# Patient Record
Sex: Female | Born: 1945 | Race: White | Hispanic: No | Marital: Married | Smoking: Never smoker
Health system: Southern US, Community
[De-identification: ages and names within clinical notes are randomized; demographics above are authoritative.]

## PROBLEM LIST (undated history)

## (undated) DIAGNOSIS — K5792 Diverticulitis of intestine, part unspecified, without perforation or abscess without bleeding: Secondary | ICD-10-CM

## (undated) DIAGNOSIS — F329 Major depressive disorder, single episode, unspecified: Secondary | ICD-10-CM

## (undated) DIAGNOSIS — K429 Umbilical hernia without obstruction or gangrene: Secondary | ICD-10-CM

## (undated) DIAGNOSIS — K219 Gastro-esophageal reflux disease without esophagitis: Secondary | ICD-10-CM

## (undated) DIAGNOSIS — E785 Hyperlipidemia, unspecified: Secondary | ICD-10-CM

## (undated) DIAGNOSIS — M1611 Unilateral primary osteoarthritis, right hip: Secondary | ICD-10-CM

## (undated) DIAGNOSIS — R011 Cardiac murmur, unspecified: Secondary | ICD-10-CM

## (undated) DIAGNOSIS — M1990 Unspecified osteoarthritis, unspecified site: Secondary | ICD-10-CM

## (undated) DIAGNOSIS — J189 Pneumonia, unspecified organism: Secondary | ICD-10-CM

## (undated) DIAGNOSIS — K589 Irritable bowel syndrome without diarrhea: Secondary | ICD-10-CM

## (undated) DIAGNOSIS — F32A Depression, unspecified: Secondary | ICD-10-CM

## (undated) DIAGNOSIS — R0602 Shortness of breath: Secondary | ICD-10-CM

## (undated) DIAGNOSIS — E039 Hypothyroidism, unspecified: Secondary | ICD-10-CM

## (undated) HISTORY — DX: Irritable bowel syndrome, unspecified: K58.9

## (undated) HISTORY — PX: TUBAL LIGATION: SHX77

---

## 2001-02-28 HISTORY — PX: ABDOMINAL HYSTERECTOMY: SHX81

## 2009-02-28 HISTORY — PX: ESOPHAGOGASTRODUODENOSCOPY: SHX1529

## 2009-02-28 HISTORY — PX: COLONOSCOPY: SHX174

## 2009-10-16 ENCOUNTER — Ambulatory Visit (HOSPITAL_COMMUNITY): Admission: RE | Admit: 2009-10-16 | Discharge: 2009-10-16 | Payer: Self-pay | Admitting: General Surgery

## 2010-05-10 ENCOUNTER — Ambulatory Visit (INDEPENDENT_AMBULATORY_CARE_PROVIDER_SITE_OTHER): Payer: Medicare Other | Admitting: Internal Medicine

## 2010-05-10 DIAGNOSIS — R197 Diarrhea, unspecified: Secondary | ICD-10-CM

## 2010-05-10 DIAGNOSIS — R198 Other specified symptoms and signs involving the digestive system and abdomen: Secondary | ICD-10-CM

## 2010-08-25 ENCOUNTER — Ambulatory Visit (INDEPENDENT_AMBULATORY_CARE_PROVIDER_SITE_OTHER): Payer: Medicare Other | Admitting: Internal Medicine

## 2010-08-25 DIAGNOSIS — K5900 Constipation, unspecified: Secondary | ICD-10-CM

## 2011-01-04 ENCOUNTER — Encounter (HOSPITAL_COMMUNITY): Payer: Self-pay | Admitting: Pharmacy Technician

## 2011-01-04 ENCOUNTER — Other Ambulatory Visit: Payer: Self-pay

## 2011-01-04 ENCOUNTER — Encounter (HOSPITAL_COMMUNITY)
Admission: RE | Admit: 2011-01-04 | Discharge: 2011-01-04 | Disposition: A | Discharge status: Home / Self Care | Payer: Medicare Other | Source: Ambulatory Visit | Attending: General Surgery | Admitting: General Surgery

## 2011-01-04 ENCOUNTER — Encounter (HOSPITAL_COMMUNITY): Payer: Self-pay

## 2011-01-04 HISTORY — DX: Hypothyroidism, unspecified: E03.9

## 2011-01-04 HISTORY — DX: Cardiac murmur, unspecified: R01.1

## 2011-01-04 HISTORY — DX: Gastro-esophageal reflux disease without esophagitis: K21.9

## 2011-01-04 HISTORY — DX: Hyperlipidemia, unspecified: E78.5

## 2011-01-04 HISTORY — DX: Shortness of breath: R06.02

## 2011-01-04 HISTORY — DX: Unspecified osteoarthritis, unspecified site: M19.90

## 2011-01-04 LAB — DIFFERENTIAL
Lymphocytes Relative: 22 % (ref 12–46)
Lymphs Abs: 1.5 10*3/uL (ref 0.7–4.0)
Monocytes Relative: 8 % (ref 3–12)
Neutrophils Relative %: 69 % (ref 43–77)

## 2011-01-04 LAB — BASIC METABOLIC PANEL
CO2: 33 mEq/L — ABNORMAL HIGH (ref 19–32)
Chloride: 100 mEq/L (ref 96–112)
Glucose, Bld: 100 mg/dL — ABNORMAL HIGH (ref 70–99)
Potassium: 3.7 mEq/L (ref 3.5–5.1)
Sodium: 139 mEq/L (ref 135–145)

## 2011-01-04 LAB — CBC
Hemoglobin: 14.2 g/dL (ref 12.0–15.0)
Platelets: 217 10*3/uL (ref 150–400)
RBC: 4.82 MIL/uL (ref 3.87–5.11)
WBC: 6.8 10*3/uL (ref 4.0–10.5)

## 2011-01-04 LAB — SURGICAL PCR SCREEN: Staphylococcus aureus: NEGATIVE

## 2011-01-04 NOTE — Patient Instructions (Addendum)
20 JING HOWATT  01/04/2011   Your procedure is scheduled on:  01/10/2011  Report to [facility] at 06:15 AM.  Call this number if you have problems the morning of surgery: [phone]    Remember:   Do not eat food:After Midnight.  Do not drink clear liquids: After Midnight.  Take these medicines the morning of surgery with A SIP OF WATER: Synthroid, Zantac   Do not wear jewelry, make-up or nail polish.  Do not wear lotions, powders, or perfumes. You may wear deodorant.  Do not shave 48 hours prior to surgery.  Do not bring valuables to the hospital.  Contacts, dentures or bridgework may not be worn into surgery.  Leave suitcase in the car. After surgery it may be brought to your room.  For patients admitted to the hospital, checkout time is 11:00 AM the day of discharge.   Patients discharged the day of surgery will not be allowed to drive home.  Name and phone number of your driver:   Special Instructions: CHG Shower Use Special Wash: 1/2 bottle night before surgery and 1/2 bottle morning of surgery.   Please read over the following fact sheets that you were given: Pain Booklet, MRSA Information, Surgical Site Infection Prevention and Care and Recovery After Surgery      Laparoscopic Cholecystectomy Laparoscopic cholecystectomy is surgery to remove the gallbladder. The gallbladder is located slightly to the right of center in the abdomen, behind the liver. It is a concentrating and storage sac for the bile produced in the liver. Bile aids in the digestion and absorption of fats. Gallbladder disease (cholecystitis) is an inflammation of your gallbladder. This condition is usually caused by a buildup of gallstones (cholelithiasis) in your gallbladder. Gallstones can block the flow of bile, resulting in inflammation and pain. In severe cases, emergency surgery may be required. When emergency surgery is not required, you will have time to prepare for the procedure. Laparoscopic  surgery is an alternative to open surgery. Laparoscopic surgery usually has a shorter recovery time. Your common bile duct may also need to be examined and explored. Your caregiver will discuss this with you if he or she feels this should be done. If stones are found in the common bile duct, they may be removed. LET YOUR CAREGIVER KNOW ABOUT:  Allergies to food or medicine.   Medicines taken, including vitamins, herbs, eyedrops, over-the-counter medicines, and creams.   Use of steroids (by mouth or creams).   Previous problems with anesthetics or numbing medicines.   History of bleeding problems or blood clots.   Previous surgery.   Other health problems, including diabetes and kidney problems.   Possibility of pregnancy, if this applies.  RISKS AND COMPLICATIONS All surgery is associated with risks. Some problems that may occur following this procedure include:  Infection.   Damage to the common bile duct, nerves, arteries, veins, or other internal organs such as the stomach or intestines.   Bleeding.   A stone may remain in the common bile duct.  BEFORE THE PROCEDURE  Do not take aspirin for 3 days prior to surgery or blood thinners for 1 week prior to surgery.   Do not eat or drink anything after midnight the night before surgery.   Let your caregiver know if you develop a cold or other infectious problem prior to surgery.   You should be present 60 minutes before the procedure or as directed.  PROCEDURE  You will be given medicine that makes you  sleep (general anesthetic). When you are asleep, your surgeon will make several small cuts (incisions) in your abdomen. One of these incisions is used to insert a small, lighted scope (laparoscope) into the abdomen. The laparoscope helps the surgeon see into your abdomen. Carbon dioxide gas will be pumped into your abdomen. The gas allows more room for the surgeon to perform your surgery. Other operating instruments are inserted  through the other incisions. Laparoscopic procedures may not be appropriate when:  There is major scarring from previous surgery.   The gallbladder is extremely inflamed.   There are bleeding disorders or unexpected cirrhosis of the liver.   A pregnancy is near term.   Other conditions make the laparoscopic procedure impossible.  If your surgeon feels it is not safe to continue with a laparoscopic procedure, he or she will perform an open abdominal procedure. In this case, the surgeon will make an incision to open the abdomen. This gives the surgeon a larger view and field to work within. This may allow the surgeon to perform procedures that sometimes cannot be performed with a laparoscope alone. Open surgery has a longer recovery time. AFTER THE PROCEDURE  You will be taken to the recovery area where a nurse will watch and check your progress.   You may be allowed to go home the same day.   Do not resume physical activities until directed by your caregiver.   You may resume a normal diet and activities as directed.  Document Released: 02/14/2005 Document Revised: 10/27/2010 Document Reviewed: 07/30/2010 [facility] Patient Information 2012 [employer], [place].

## 2011-01-05 NOTE — Consult Note (Signed)
[name], [name]            ACCOUNT NO.:  [id number]  MEDICAL RECORD NO.:  [id number]  LOCATION:                                 FACILITY:  PHYSICIAN:  Barbaraann Barthel, M.D. DATE OF BIRTH:  11-25-1945  DATE OF CONSULTATION: DATE OF DISCHARGE:                                CONSULTATION   DIAGNOSIS:  Cholelithiasis, cholecystitis.  CHIEF COMPLAINT:  Right upper quadrant pain with nausea for approximately 2 years with a recent episode being several days ago.  HISTORY OF PRESENT MEDICAL:  The patient states that she has had some recurrent postprandial pain that has awakened her few hours after eating on and off for the last couple of years.  She dismissed this as being insignificant until this was more prominent over the last few days.  She had a sonogram which revealed the presence of cholelithiasis.  At that time, she also had liver function studies, which were grossly within normal limits.  Lipase was not elevated.  PAST MEDICAL HISTORY:  She has had a history of irritable bowel syndrome.  No other significant medical problems reported.  She was referred by Dr. Sherril Croon for surgery.  In the workup, she was noted to have a murmur which was investigated further with an echocardiogram. This revealed the presence of possible pericardial effusion and that this is being investigated further preoperatively.  We discussed this with Dr. Sherril Croon and he is going to obtain a CT scan, and get back with me to clear her for surgery.  PAST SURGERIES:  Included a C-section in 1967.  The tubal ligation in the 70s and a vaginal hysterectomy in 2003.  PHYSICAL EXAMINATION:  VITAL SIGNS:  This is a pleasant 65 year old white female who is 5 foot 7 inches and weighs 195 pounds, her temperature is 98.7, her pulse was 86 and regular, respirations are 12 per minute.  Blood pressure is 102/68. HEAD:  Normocephalic. EYES:  Extraocular movements are intact.  Pupils are equal and round and react to  light and accommodation. NECK:  There are no bruits auscultated and no adenopathy noted. CHEST:  Clear.  I do not appreciate any murmurs on my examination. BREASTS AND AXILLA:  Are without masses. ABDOMEN:  The patient is mildly tender in the right upper quadrant. There is no rebound tenderness appreciated. RECTAL EXAMINATION:  The stool is guaiac-negative. EXTREMITIES:  The patient has a chronic right ankle and knee arthritic complaints.  The patient is also has been told she has a Baker cyst in the posterior aspect of the left knee.  REVIEW OF SYSTEMS:  NEURO SYSTEM:  No history of migraines or seizures, grossly no lateralizing or localizing's neurological symptoms. ENDOCRINE SYSTEM:  No history of diabetes.  The patient has a history of hypothyroidism for which she takes thyroid supplement. CARDIOPULMONARY SYSTEM:  The patient has recently been told she had a murmur and she is being worked up preoperatively for this. MUSCULOSKELETAL SYSTEM:  Chronic arthritic complaints of knees and ankles.  OB/GYN HISTORY:  She is a gravida 3, para 2, cesarean 1, abortus 0 female who has no family history of first line relatives of carcinoma of the breast.  She has undergone a vaginal hysterectomy  as stated in 2003 and prior to this a C-section and tubal ligation in 1967 and 1970s respectively.  The patient underwent a mammogram in October 2012 which she reports as normal.  GI SYSTEM:  No history of hepatitis, diarrhea bright red rectal bleeding or melena.  No unexplained weight loss.  She had a colonoscopy in 2011. She has a history of irritable bowel syndrome and she occasionally has bouts of constipation. GU SYSTEM:  No history of nephrolithiasis.  No history of dysuria.  LABORATORY DATA:  The patient's last laboratory data shows a white count of 6.5 with an H and H of 14.5 and 41.6 with a platelet count of 233,000.  Blood sugar was 114 and metabolic 7 was grossly within normal limits.   Creatinine is 0.7 and a BUN of 17.  The liver function studies are all grossly within normal limits.  Cholesterol was 246.  Abdominal ultrasound showed small stones seen within the gallbladder. The common bile duct is not dilated.  IMPRESSION:  Therefore, [name] has cholecystitis secondary to cholelithiasis. Other medical problems include 1. Obesity. 2. Hypercholesteremia. 3. Chronic arthritis. 4. Hypothyroidism. 5. Pericardial effusion which is being worked up at present.  PLAN:  Following medical clearance, we will plan on doing an elective laparoscopic cholecystectomy on her.  She has been placed on a restricted diet in the interim.  I discussed surgery with her in detail, discussing complications not limited to, but including bleeding, infection, damage to bile ducts, perforation of organs, and transitory diarrhea postoperatively.  Informed consent was obtained.  We will plan for surgery on Monday if she is cleared and this will be coordinated with her internist.     Barbaraann Barthel, M.D.     WB/MEDQ  D:  01/04/2011  T:  01/04/2011  Job:  [id number]  cc:   Dr. Sherril Croon

## 2011-01-10 ENCOUNTER — Encounter (HOSPITAL_COMMUNITY): Payer: Self-pay | Admitting: *Deleted

## 2011-01-10 ENCOUNTER — Other Ambulatory Visit: Payer: Self-pay | Admitting: General Surgery

## 2011-01-10 ENCOUNTER — Encounter (HOSPITAL_COMMUNITY): Payer: Self-pay | Admitting: Anesthesiology

## 2011-01-10 ENCOUNTER — Ambulatory Visit (HOSPITAL_COMMUNITY): Payer: Medicare Other | Admitting: Anesthesiology

## 2011-01-10 ENCOUNTER — Inpatient Hospital Stay (HOSPITAL_COMMUNITY)
Admission: RE | Admit: 2011-01-10 | Discharge: 2011-01-11 | DRG: 419 | Disposition: A | Discharge status: Home / Self Care | Payer: Medicare Other | Source: Ambulatory Visit | Attending: General Surgery | Admitting: General Surgery

## 2011-01-10 ENCOUNTER — Encounter (HOSPITAL_COMMUNITY): Payer: Self-pay

## 2011-01-10 ENCOUNTER — Encounter (HOSPITAL_COMMUNITY)
Admission: RE | Disposition: A | Discharge status: Home / Self Care | Payer: Self-pay | Source: Ambulatory Visit | Attending: General Surgery

## 2011-01-10 DIAGNOSIS — K219 Gastro-esophageal reflux disease without esophagitis: Secondary | ICD-10-CM | POA: Diagnosis present

## 2011-01-10 DIAGNOSIS — K8010 Calculus of gallbladder with chronic cholecystitis without obstruction: Principal | ICD-10-CM | POA: Diagnosis present

## 2011-01-10 DIAGNOSIS — E669 Obesity, unspecified: Secondary | ICD-10-CM | POA: Diagnosis present

## 2011-01-10 DIAGNOSIS — Z9049 Acquired absence of other specified parts of digestive tract: Secondary | ICD-10-CM

## 2011-01-10 DIAGNOSIS — E7800 Pure hypercholesterolemia, unspecified: Secondary | ICD-10-CM | POA: Diagnosis present

## 2011-01-10 DIAGNOSIS — I10 Essential (primary) hypertension: Secondary | ICD-10-CM | POA: Diagnosis present

## 2011-01-10 DIAGNOSIS — M129 Arthropathy, unspecified: Secondary | ICD-10-CM | POA: Diagnosis present

## 2011-01-10 DIAGNOSIS — E039 Hypothyroidism, unspecified: Secondary | ICD-10-CM | POA: Diagnosis present

## 2011-01-10 HISTORY — PX: CHOLECYSTECTOMY: SHX55

## 2011-01-10 SURGERY — LAPAROSCOPIC CHOLECYSTECTOMY
Anesthesia: General | Site: Abdomen | Wound class: Clean Contaminated

## 2011-01-10 MED ORDER — ROCURONIUM BROMIDE 50 MG/5ML IV SOLN
INTRAVENOUS | Status: AC
  Filled 2011-01-10: qty 1

## 2011-01-10 MED ORDER — MIDAZOLAM HCL 2 MG/2ML IJ SOLN
INTRAMUSCULAR | Status: AC
  Administered 2011-01-10: 2 mg via INTRAVENOUS
  Filled 2011-01-10: qty 2

## 2011-01-10 MED ORDER — MIDAZOLAM HCL 5 MG/5ML IJ SOLN
INTRAMUSCULAR | Status: DC | PRN
  Administered 2011-01-10: 2 mg via INTRAVENOUS

## 2011-01-10 MED ORDER — PROPOFOL 10 MG/ML IV EMUL
INTRAVENOUS | Status: DC | PRN
  Administered 2011-01-10 (×2): 150 mg via INTRAVENOUS

## 2011-01-10 MED ORDER — FENTANYL CITRATE 0.05 MG/ML IJ SOLN
INTRAMUSCULAR | Status: DC | PRN
  Administered 2011-01-10 (×5): 50 ug via INTRAVENOUS

## 2011-01-10 MED ORDER — DEXTROSE 5 % IV SOLN
1.0000 g | INTRAVENOUS | Status: DC | PRN
  Administered 2011-01-10: 1 g via INTRAVENOUS

## 2011-01-10 MED ORDER — BUPIVACAINE HCL (PF) 0.5 % IJ SOLN
INTRAMUSCULAR | Status: DC | PRN
  Administered 2011-01-10: 7 mL

## 2011-01-10 MED ORDER — LEVOTHYROXINE SODIUM 88 MCG PO TABS
88.0000 ug | ORAL_TABLET | Freq: Every day | ORAL | Status: DC
  Administered 2011-01-11: 88 ug via ORAL
  Filled 2011-01-10 (×3): qty 1

## 2011-01-10 MED ORDER — ONDANSETRON HCL 4 MG/2ML IJ SOLN
4.0000 mg | Freq: Once | INTRAMUSCULAR | Status: AC
  Administered 2011-01-10: 4 mg via INTRAVENOUS

## 2011-01-10 MED ORDER — DEXTROSE 5 % IV SOLN
INTRAVENOUS | Status: AC
  Filled 2011-01-10: qty 10

## 2011-01-10 MED ORDER — LACTATED RINGERS IV SOLN
INTRAVENOUS | Status: DC
  Administered 2011-01-10: 1000 mL via INTRAVENOUS
  Administered 2011-01-10: 09:00:00 via INTRAVENOUS

## 2011-01-10 MED ORDER — MORPHINE SULFATE 2 MG/ML IJ SOLN
1.0000 mg | INTRAMUSCULAR | Status: DC | PRN
  Administered 2011-01-10: 1 mg via INTRAVENOUS
  Filled 2011-01-10: qty 1

## 2011-01-10 MED ORDER — DEXTROSE 5 % IV SOLN: 1.0000 g | Freq: Once | INTRAVENOUS | Status: DC

## 2011-01-10 MED ORDER — DEXAMETHASONE SODIUM PHOSPHATE 4 MG/ML IJ SOLN
4.0000 mg | INTRAMUSCULAR | Status: AC
  Administered 2011-01-10: 4 mg via INTRAVENOUS

## 2011-01-10 MED ORDER — ONDANSETRON HCL 4 MG/2ML IJ SOLN
4.0000 mg | Freq: Four times a day (QID) | INTRAMUSCULAR | Status: DC | PRN
  Administered 2011-01-10: 4 mg via INTRAVENOUS
  Filled 2011-01-10: qty 2

## 2011-01-10 MED ORDER — WATER FOR IRRIGATION, STERILE IR SOLN
Status: DC | PRN
  Administered 2011-01-10: 2000 mL

## 2011-01-10 MED ORDER — INFLUENZA VIRUS VACC SPLIT PF IM SUSP
0.5000 mL | INTRAMUSCULAR | Status: AC
  Administered 2011-01-11: 0.5 mL via INTRAMUSCULAR
  Filled 2011-01-10: qty 0.5

## 2011-01-10 MED ORDER — BUPIVACAINE HCL (PF) 0.5 % IJ SOLN
INTRAMUSCULAR | Status: AC
  Filled 2011-01-10: qty 30

## 2011-01-10 MED ORDER — GLYCOPYRROLATE 0.2 MG/ML IJ SOLN
INTRAMUSCULAR | Status: DC | PRN
  Administered 2011-01-10: .4 mg via INTRAVENOUS

## 2011-01-10 MED ORDER — LIDOCAINE HCL 1 % IJ SOLN
INTRAMUSCULAR | Status: DC | PRN
  Administered 2011-01-10: 25 mg via INTRADERMAL

## 2011-01-10 MED ORDER — DEXAMETHASONE SODIUM PHOSPHATE 4 MG/ML IJ SOLN
INTRAMUSCULAR | Status: AC
  Administered 2011-01-10: 4 mg via INTRAVENOUS
  Filled 2011-01-10: qty 1

## 2011-01-10 MED ORDER — GLYCOPYRROLATE 0.2 MG/ML IJ SOLN
INTRAMUSCULAR | Status: AC
  Administered 2011-01-10: 0.2 mg via INTRAVENOUS
  Filled 2011-01-10: qty 1

## 2011-01-10 MED ORDER — FAMOTIDINE 20 MG PO TABS
10.0000 mg | ORAL_TABLET | Freq: Every day | ORAL | Status: DC
  Administered 2011-01-10 – 2011-01-11 (×2): 10 mg via ORAL
  Filled 2011-01-10: qty 1
  Filled 2011-01-10: qty 2

## 2011-01-10 MED ORDER — ONDANSETRON HCL 4 MG PO TABS: 4.0000 mg | ORAL_TABLET | Freq: Four times a day (QID) | ORAL | Status: DC | PRN

## 2011-01-10 MED ORDER — NEOSTIGMINE METHYLSULFATE 1 MG/ML IJ SOLN
INTRAMUSCULAR | Status: AC
  Filled 2011-01-10: qty 10

## 2011-01-10 MED ORDER — ACETAMINOPHEN 325 MG PO TABS: 650.0000 mg | ORAL_TABLET | ORAL | Status: DC | PRN

## 2011-01-10 MED ORDER — SCOPOLAMINE 1 MG/3DAYS TD PT72
1.0000 | MEDICATED_PATCH | Freq: Once | TRANSDERMAL | Status: DC
  Administered 2011-01-10: 1.5 mg via TRANSDERMAL

## 2011-01-10 MED ORDER — MIDAZOLAM HCL 2 MG/2ML IJ SOLN
INTRAMUSCULAR | Status: AC
  Filled 2011-01-10: qty 2

## 2011-01-10 MED ORDER — GLYCOPYRROLATE 0.2 MG/ML IJ SOLN
INTRAMUSCULAR | Status: AC
  Filled 2011-01-10: qty 1

## 2011-01-10 MED ORDER — ONDANSETRON HCL 4 MG/2ML IJ SOLN
INTRAMUSCULAR | Status: AC
  Filled 2011-01-10: qty 2

## 2011-01-10 MED ORDER — ONDANSETRON HCL 4 MG/2ML IJ SOLN
INTRAMUSCULAR | Status: AC
  Administered 2011-01-10: 4 mg via INTRAVENOUS
  Filled 2011-01-10: qty 2

## 2011-01-10 MED ORDER — ONDANSETRON HCL 4 MG/2ML IJ SOLN
4.0000 mg | Freq: Once | INTRAMUSCULAR | Status: AC | PRN
  Administered 2011-01-10: 4 mg via INTRAVENOUS

## 2011-01-10 MED ORDER — FENTANYL CITRATE 0.05 MG/ML IJ SOLN
INTRAMUSCULAR | Status: AC
  Administered 2011-01-10: 50 ug via INTRAVENOUS
  Filled 2011-01-10: qty 5

## 2011-01-10 MED ORDER — SODIUM CHLORIDE 0.9 % IR SOLN
Status: DC | PRN
  Administered 2011-01-10: 1000 mL

## 2011-01-10 MED ORDER — ROCURONIUM BROMIDE 100 MG/10ML IV SOLN
INTRAVENOUS | Status: DC | PRN
  Administered 2011-01-10: 40 mg via INTRAVENOUS

## 2011-01-10 MED ORDER — POTASSIUM CHLORIDE IN NACL 20-0.9 MEQ/L-% IV SOLN
INTRAVENOUS | Status: DC
  Administered 2011-01-10 (×2): via INTRAVENOUS

## 2011-01-10 MED ORDER — FENTANYL CITRATE 0.05 MG/ML IJ SOLN
25.0000 ug | INTRAMUSCULAR | Status: DC | PRN
  Administered 2011-01-10: 25 ug via INTRAVENOUS
  Administered 2011-01-10: 50 ug via INTRAVENOUS
  Administered 2011-01-10: 25 ug via INTRAVENOUS

## 2011-01-10 MED ORDER — NEOSTIGMINE METHYLSULFATE 1 MG/ML IJ SOLN
INTRAMUSCULAR | Status: DC | PRN
  Administered 2011-01-10: 3 mg via INTRAVENOUS

## 2011-01-10 MED ORDER — MIDAZOLAM HCL 2 MG/2ML IJ SOLN
1.0000 mg | INTRAMUSCULAR | Status: DC | PRN
  Administered 2011-01-10: 2 mg via INTRAVENOUS

## 2011-01-10 MED ORDER — PNEUMOCOCCAL VAC POLYVALENT 25 MCG/0.5ML IJ INJ
0.5000 mL | INJECTION | INTRAMUSCULAR | Status: DC
  Filled 2011-01-10: qty 0.5

## 2011-01-10 MED ORDER — HYDROCHLOROTHIAZIDE 25 MG PO TABS
25.0000 mg | ORAL_TABLET | Freq: Every day | ORAL | Status: DC
  Administered 2011-01-10 – 2011-01-11 (×2): 25 mg via ORAL
  Filled 2011-01-10 (×2): qty 1

## 2011-01-10 MED ORDER — LIDOCAINE HCL (PF) 1 % IJ SOLN
INTRAMUSCULAR | Status: AC
  Filled 2011-01-10: qty 5

## 2011-01-10 MED ORDER — SODIUM CHLORIDE 0.9 % IR SOLN
Status: DC | PRN
  Administered 2011-01-10: 3000 mL

## 2011-01-10 MED ORDER — GLYCOPYRROLATE 0.2 MG/ML IJ SOLN
0.2000 mg | Freq: Once | INTRAMUSCULAR | Status: AC
  Administered 2011-01-10: 0.2 mg via INTRAVENOUS

## 2011-01-10 MED ORDER — SCOPOLAMINE 1 MG/3DAYS TD PT72
MEDICATED_PATCH | TRANSDERMAL | Status: AC
  Administered 2011-01-10: 1.5 mg via TRANSDERMAL
  Filled 2011-01-10: qty 1

## 2011-01-10 MED ORDER — PROPOFOL 10 MG/ML IV EMUL
INTRAVENOUS | Status: AC
  Filled 2011-01-10: qty 20

## 2011-01-10 MED ORDER — BIOTENE DRY MOUTH MT LIQD
15.0000 mL | Freq: Two times a day (BID) | OROMUCOSAL | Status: DC
  Administered 2011-01-10: 15 mL via OROMUCOSAL

## 2011-01-10 SURGICAL SUPPLY — 69 items
APPLICATOR COTTON TIP 6IN STRL (MISCELLANEOUS) IMPLANT
APPLIER CLIP ROT 10 11.4 M/L (STAPLE) ×2
ATTRACTOMAT 16X20 MAGNETIC DRP (DRAPES) IMPLANT
BAG HAMPER (MISCELLANEOUS) ×2 IMPLANT
BLADE SURG 15 STRL LF DISP TIS (BLADE) ×2 IMPLANT
BLADE SURG 15 STRL SS (BLADE) ×2
BLADE SURG SZ10 CARB STEEL (BLADE) IMPLANT
CLIP APPLIE ROT 10 11.4 M/L (STAPLE) ×1 IMPLANT
CLOTH BEACON ORANGE TIMEOUT ST (SAFETY) ×2 IMPLANT
COVER LIGHT HANDLE STERIS (MISCELLANEOUS) ×4 IMPLANT
DECANTER SPIKE VIAL GLASS SM (MISCELLANEOUS) ×2 IMPLANT
DISSECTOR BLUNT TIP ENDO 5MM (MISCELLANEOUS) ×2 IMPLANT
DRAPE WARM FLUID 44X44 (DRAPE) IMPLANT
DRSG TEGADERM 2-3/8X2-3/4 SM (GAUZE/BANDAGES/DRESSINGS) ×2 IMPLANT
ELECT BLADE 6 FLAT ULTRCLN (ELECTRODE) IMPLANT
ELECT REM PT RETURN 9FT ADLT (ELECTROSURGICAL) ×2
ELECTRODE REM PT RTRN 9FT ADLT (ELECTROSURGICAL) ×1 IMPLANT
EVACUATOR DRAINAGE 10X20 100CC (DRAIN) ×1 IMPLANT
EVACUATOR SILICONE 100CC (DRAIN) ×1
FILTER SMOKE EVAC LAPAROSHD (FILTER) ×2 IMPLANT
FORMALIN 10 PREFIL 120ML (MISCELLANEOUS) ×2 IMPLANT
GAUZE SPONGE 4X4 12PLY STRL LF (GAUZE/BANDAGES/DRESSINGS) ×2 IMPLANT
GLOVE BIOGEL PI IND STRL 7.0 (GLOVE) ×1 IMPLANT
GLOVE BIOGEL PI IND STRL 7.5 (GLOVE) ×1 IMPLANT
GLOVE BIOGEL PI INDICATOR 7.0 (GLOVE) ×1
GLOVE BIOGEL PI INDICATOR 7.5 (GLOVE) ×1
GLOVE ECLIPSE 6.5 STRL STRAW (GLOVE) ×2 IMPLANT
GLOVE ECLIPSE 7.0 STRL STRAW (GLOVE) ×2 IMPLANT
GLOVE EXAM NITRILE MD LF STRL (GLOVE) ×2 IMPLANT
GLOVE SKINSENSE NS SZ7.0 (GLOVE) ×1
GLOVE SKINSENSE STRL SZ7.0 (GLOVE) ×1 IMPLANT
GOWN BRE IMP SLV AUR XL STRL (GOWN DISPOSABLE) ×2 IMPLANT
GOWN STRL REIN XL XLG (GOWN DISPOSABLE) ×4 IMPLANT
HEMOSTAT SNOW SURGICEL 2X4 (HEMOSTASIS) ×2 IMPLANT
HEMOSTAT SURGICEL 4X8 (HEMOSTASIS) ×2 IMPLANT
INST SET LAPROSCOPIC AP (KITS) ×2 IMPLANT
IV NS IRRIG 3000ML ARTHROMATIC (IV SOLUTION) ×2 IMPLANT
KIT ROOM TURNOVER APOR (KITS) ×2 IMPLANT
KIT TROCAR LAP CHOLE (TROCAR) ×2 IMPLANT
MANIFOLD NEPTUNE II (INSTRUMENTS) ×2 IMPLANT
NS IRRIG 1000ML POUR BTL (IV SOLUTION) ×2 IMPLANT
PACK LAP CHOLE LZT030E (CUSTOM PROCEDURE TRAY) ×2 IMPLANT
PAD ARMBOARD 7.5X6 YLW CONV (MISCELLANEOUS) ×2 IMPLANT
PENCIL HANDSWITCHING (ELECTRODE) IMPLANT
POUCH SPECIMEN RETRIEVAL 10MM (ENDOMECHANICALS) ×2 IMPLANT
SET BASIN LINEN APH (SET/KITS/TRAYS/PACK) ×2 IMPLANT
SET TUBE IRRIG SUCTION NO TIP (IRRIGATION / IRRIGATOR) ×2 IMPLANT
SOL PREP PROV IODINE SCRUB 4OZ (MISCELLANEOUS) ×2 IMPLANT
SPONGE DRAIN TRACH 4X4 STRL 2S (GAUZE/BANDAGES/DRESSINGS) ×2 IMPLANT
SPONGE GAUZE 4X4 12PLY (GAUZE/BANDAGES/DRESSINGS) ×2 IMPLANT
SPONGE INTESTINAL PEANUT (DISPOSABLE) IMPLANT
SPONGE LAP 18X18 X RAY DECT (DISPOSABLE) IMPLANT
STAPLER VISISTAT 35W (STAPLE) ×2 IMPLANT
SUT ETHILON 3 0 FSL (SUTURE) ×2 IMPLANT
SUT SILK 2 0 (SUTURE)
SUT SILK 2 0 SH (SUTURE) IMPLANT
SUT SILK 2-0 18XBRD TIE 12 (SUTURE) IMPLANT
SUT SILK 3 0 SH CR/8 (SUTURE) IMPLANT
SUT VIC AB 0 CT1 27 (SUTURE)
SUT VIC AB 0 CT1 27XBRD ANTBC (SUTURE) IMPLANT
SUT VIC AB 0 CT1 27XCR 8 STRN (SUTURE) IMPLANT
SUT VICRYL 0 UR6 27IN ABS (SUTURE) ×2 IMPLANT
SYR BULB IRRIGATION 50ML (SYRINGE) IMPLANT
TAPE CLOTH SURG 4X10 WHT LF (GAUZE/BANDAGES/DRESSINGS) ×2 IMPLANT
TOWEL OR 17X26 4PK STRL BLUE (TOWEL DISPOSABLE) IMPLANT
TRAY FOLEY CATH 14FR (SET/KITS/TRAYS/PACK) ×2 IMPLANT
WARMER LAPAROSCOPE (MISCELLANEOUS) ×2 IMPLANT
WATER STERILE IRR 1000ML POUR (IV SOLUTION) ×4 IMPLANT
YANKAUER SUCT BULB TIP 10FT TU (MISCELLANEOUS) IMPLANT

## 2011-01-10 NOTE — Brief Op Note (Signed)
01/10/2011  9:42 AM  PATIENT:  [name]  65 y.o. female  PRE-OPERATIVE DIAGNOSIS:  Cholelithiasis, Cholecystitis  POST-OPERATIVE DIAGNOSIS:  Cholelithiasis, Cholecystitis  PROCEDURE:  Procedure(s): LAPAROSCOPIC CHOLECYSTECTOMY  SURGEON:  Surgeon(s): Marlane Hatcher  PHYSICIAN ASSISTANT:   ASSISTANTS: none   ANESTHESIA:   general  EBL:  Total I/O In: 1550 [I.V.:1500; IV Piggyback:50] Out: 355 [Urine:300; Other:30; Blood:25]  BLOOD ADMINISTERED:none  DRAINS: JP drain in liver bed  LOCAL MEDICATIONS USED:  MARCAINE 10CC  SPECIMEN:  Source of Specimen:  gall bladder and stones  DISPOSITION OF SPECIMEN:  PATHOLOGY  COUNTS:  YES  TOURNIQUET:  * No tourniquets in log *  DICTATION: .Other Dictation: Dictation Number OR Dict. # [id number]  PLAN OF CARE: Admit for overnight observation  PATIENT DISPOSITION:  PACU - hemodynamically stable.   Delay start of Pharmacological VTE agent (>24hrs) due to surgical blood loss or risk of bleeding:  {YES/NO/NOT APPLICABLE:20182

## 2011-01-10 NOTE — Anesthesia Preprocedure Evaluation (Addendum)
Anesthesia Evaluation  Patient identified by MRN, date of birth, ID band Patient awake    Reviewed: Allergy & Precautions, H&P , NPO status , Patient's Chart, lab work & pertinent test results  History of Anesthesia Complications (+) PONV  Airway Mallampati: I TM Distance: >3 FB Neck ROM: Full    Dental  (+) Teeth Intact   Pulmonary shortness of breath and with exertion,    Pulmonary exam normal       Cardiovascular hypertension, Pt. on medications + Valvular Problems/Murmurs Regular Normal    Neuro/Psych    GI/Hepatic GERD-  Medicated and Controlled,  Endo/Other  Hypothyroidism   Renal/GU      Musculoskeletal   Abdominal   Peds  Hematology   Anesthesia Other Findings   Reproductive/Obstetrics                           Anesthesia Physical Anesthesia Plan  ASA: II  Anesthesia Plan: General   Post-op Pain Management:    Induction: Intravenous, Rapid sequence and Cricoid pressure planned  Airway Management Planned: Oral ETT  Additional Equipment:   Intra-op Plan:   Post-operative Plan: Extubation in OR  Informed Consent: I have reviewed the patients History and Physical, chart, labs and discussed the procedure including the risks, benefits and alternatives for the proposed anesthesia with the patient or authorized representative who has indicated his/her understanding and acceptance.     Plan Discussed with: CRNA  Anesthesia Plan Comments:        Anesthesia Quick Evaluation

## 2011-01-10 NOTE — Anesthesia Procedure Notes (Addendum)
Procedure Name: Intubation Date/Time: 01/10/2011 8:07 AM Performed by: Despina Hidden Pre-anesthesia Checklist: Emergency Drugs available, Suction available, Patient being monitored and Patient identified Patient Re-evaluated:Patient Re-evaluated prior to inductionOxygen Delivery Method: Circle System Utilized Preoxygenation: Pre-oxygenation with 100% oxygen Intubation Type: IV induction and Circoid Pressure applied Ventilation: Mask ventilation with difficulty Laryngoscope Size: 3 and Mac Grade View: Grade I Tube type: Oral Tube size: 7.0 mm Airway Equipment and Method: stylet Placement Confirmation: ETT inserted through vocal cords under direct vision,  positive ETCO2 and breath sounds checked- equal and bilateral Secured at: 22 cm Tube secured with: Tape Dental Injury: Teeth and Oropharynx as per pre-operative assessment  Comments: Gel donut used under head

## 2011-01-10 NOTE — Op Note (Signed)
[name], [name]            ACCOUNT NO.:  [id number]  MEDICAL RECORD NO.:  [id number]  LOCATION:  [facility]                          FACILITY:  [facility]  PHYSICIAN:  Barbaraann Barthel, M.D. DATE OF BIRTH:  09-12-1945  DATE OF PROCEDURE:  01/10/2011 DATE OF DISCHARGE:                              OPERATIVE REPORT   SURGEON:  Barbaraann Barthel, MD  PREOPERATIVE DIAGNOSIS:  Cholecystitis and cholelithiasis.  POSTOPERATIVE DIAGNOSES:  Cholecystitis and cholelithiasis.  PROCEDURE:  Laparoscopic cholecystectomy.  SPECIMEN:  Gallbladder and stones.  WOUND CLASSIFICATION:  Clean contaminated.  Note, this is a 65 year old white female who was referred from Dr. Sherril Croon' office in [place] for cholecystectomy when this patient was found to be symptomatic from biliary colic.  In essence, she had over 2 years of symptoms of recurrent right upper quadrant postprandial pain with a recent flare-up.  During her workup, her sonogram revealed that she had cholelithiasis.  She also had an echocardiogram done preoperatively, which suggested the possibility of pericardial fluid.  This was investigated further with a CT scan, which did not reveal any pericardial fluid.  She was cleared for surgery.  Her liver function studies preoperatively and lipase were grossly within normal limits.  We discussed surgery with her in detail discussing complications not limited to, but including bleeding, infection, damage to bile ducts, perforation of organs, and the possibility of open surgery might be required.  We also discussed the possibility of transitory diarrhea. Informed consent was obtained.  GROSS OPERATIVE FINDINGS:  The patient had multiple adhesions and an inflamed lymph node around the gallbladder.  She had a cystic duct, which was not enlarged and I did not palpate with the [name] any stones within it.  No other abnormalities around the right upper quadrant.  She did have some adhesions around  the umbilicus for which she had previous gynecological surgery.  There were no abnormal encounters.  TECHNIQUE:  The patient was placed in supine position.  After the adequate administration of general anesthesia via endotracheal intubation, her entire abdomen was prepped with Betadine solution. Prior to this, a Foley catheter was aseptically inserted.  A periumbilical incision was carried out over the superior aspect of the umbilicus down to the fascia, which was grasped with a sharp towel clip and a Veress needle was inserted and confirmed the position with a saline drop test.  We then insufflated the abdomen with approximately 3.5 L of CO2 and then placed an 11-mm cannula in the umbilicus using the Visiport technique.  Another 11-mm cannula was placed under direct vision in the epigastrium and two 5-mm cannulas were placed in the right upper quadrant laterally.  The gallbladder was grasped and adhesions were taken down.  The cystic duct was clearly visualized, triply silver clipped on the side of the common bile duct, and singly silver clipped on the side of the gallbladder and divided.  The cystic artery was likewise ligated with silver clips and divided.  There were a couple of posterior twigs, which were clipped as well as the patient had an inflamed lymph node in this area.  The gallbladder was then removed using the hook cautery device from the liver bed without any spillage. We  then placed it in the endosac and we retrieved it from the peritoneal cavity.  We checked for hemostasis and irrigated and I elected to leave a piece of Surgicel on the liver bed and a Jackson-Pratt drain, which I will remove on the 1st postoperative day as well and this was exited from one of the lateral port sites.  Prior to closure, all sponge, needle, and instrument counts were found to be correct.  Estimated blood loss was less than 25 mL.  The patient received 1400 mL of  crystalloids intraoperatively.  Operating time was less than an hour.  There were no complications.     Barbaraann Barthel, M.D.     WB/MEDQ  D:  01/10/2011  T:  01/10/2011  Job:  [id number]  cc:   Doreen Beam, MD Fax: [phone]

## 2011-01-10 NOTE — Anesthesia Postprocedure Evaluation (Addendum)
  Anesthesia Post-op Note  Patient: [name]  Procedure(s) Performed:  LAPAROSCOPIC CHOLECYSTECTOMY  Patient Location: PACU  Anesthesia Type: General  Level of Consciousness: awake, alert , oriented and patient cooperative  Airway and Oxygen Therapy: Patient Spontanous Breathing  Post-op Pain: 2 /10, mild  Post-op Assessment: Post-op Vital signs reviewed, Patient's Cardiovascular Status Stable, Respiratory Function Stable, Patent Airway, No signs of Nausea or vomiting and Pain level controlled  Post-op Vital Signs: Reviewed and stable  Complications: No apparent anesthesia complications  Pt. Discharged. No apparent complications T.Shanae Luo CRNA 01/11/2011 1126

## 2011-01-10 NOTE — Progress Notes (Signed)
Post Op Check  Filed Vitals:   01/10/11 1430  BP: 139/72  Pulse: 82  Temp: 98.4 F (36.9 C)  Resp: 18    Awake and alert dressings dry and in tact.  Min. JP drainage.   No leg pain no shortness of breath.  Voided in RR but not in room yet.  Pain under control with present regimen.

## 2011-01-10 NOTE — Transfer of Care (Signed)
Immediate Anesthesia Transfer of Care Note  Patient: [name]  Procedure(s) Performed:  LAPAROSCOPIC CHOLECYSTECTOMY  Patient Location: PACU  Anesthesia Type: General  Level of Consciousness: awake and patient cooperative  Airway & Oxygen Therapy: Patient Spontanous Breathing and Patient connected to face mask oxygen  Post-op Assessment: Report given to PACU RN, Post -op Vital signs reviewed and stable and Patient moving all extremities X 4  Post vital signs: Reviewed and stable  Complications: No apparent anesthesia complications

## 2011-01-10 NOTE — Plan of Care (Signed)
Problem: Phase I Progression Outcomes Goal: Tubes/drains patent Outcome: Completed/Met Date Met:  01/10/11 jp drain Goal: Voiding-avoid urinary catheter unless indicated Outcome: Completed/Met Date Met:  01/10/11 No foley

## 2011-01-10 NOTE — Progress Notes (Signed)
Dr. Jayme Cloud notified of heart rate occasionally dropping to upper 40's.  Sinus arrythmia

## 2011-01-10 NOTE — Progress Notes (Signed)
No clinical change since H&P.  Dict # [id number]  Pt cleared for surgery by Med service.  There was some question of a small pericardial effusion.  This was not seen on CT scan.  Labs reviewed.  Procedure and risks discussed and informed consent obtained.

## 2011-01-11 LAB — CBC
HCT: 35.6 % — ABNORMAL LOW (ref 36.0–46.0)
Hemoglobin: 12.2 g/dL (ref 12.0–15.0)
RBC: 4.12 MIL/uL (ref 3.87–5.11)
WBC: 8.1 10*3/uL (ref 4.0–10.5)

## 2011-01-11 LAB — BASIC METABOLIC PANEL
BUN: 6 mg/dL (ref 6–23)
Chloride: 101 mEq/L (ref 96–112)
Glucose, Bld: 99 mg/dL (ref 70–99)
Potassium: 4 mEq/L (ref 3.5–5.1)
Sodium: 136 mEq/L (ref 135–145)

## 2011-01-11 LAB — HEPATIC FUNCTION PANEL
ALT: 28 U/L (ref 0–35)
AST: 30 U/L (ref 0–37)
Albumin: 3.1 g/dL — ABNORMAL LOW (ref 3.5–5.2)
Bilirubin, Direct: 0.1 mg/dL (ref 0.0–0.3)
Total Bilirubin: 0.5 mg/dL (ref 0.3–1.2)

## 2011-01-11 MED ORDER — OXYCODONE-ACETAMINOPHEN 2.5-325 MG PO TABS: 2.0000 | ORAL_TABLET | ORAL | Status: AC | PRN

## 2011-01-11 NOTE — Progress Notes (Signed)
Discharge note dictated no number was given.

## 2011-01-11 NOTE — Discharge Summary (Signed)
[name], [name]            ACCOUNT NO.:  [id number]  MEDICAL RECORD NO.:  [id number]  LOCATION:  [facility]                          FACILITY:  [facility]  PHYSICIAN:  Barbaraann Barthel, M.D. DATE OF BIRTH:  Feb 23, 1946  DATE OF ADMISSION:  01/10/2011 DATE OF DISCHARGE:  11/13/2012LH                              DISCHARGE SUMMARY   DIAGNOSES:  Cholecystitis and cholelithiasis.  SECONDARY DIAGNOSES: 1. Hypothyroidism. 2. Hypercholesterolemia. 3. Chronic arthritis. 4. Obesity.  PROCEDURE:  On January 10, 2011, laparoscopic cholecystectomy.  HOSPITAL COURSE:  This 65 year old white female was admitted via the outpatient department after being cleared for surgery by the Medical Service.  There was some question or not whether she had a pericardial infusion, this turned out to be over reading of an echocardiogram.  She was cleared for surgery and she underwent an uneventful laparoscopic cholecystectomy via the outpatient department.  Her hospital course was completely uneventful.  She was discharged on the following day after 24-hour period of observation.  At that time, her wound was clean.  She was tolerating p.o. solids.  She had no dysuria, leg pain, or shortness of breath.  Her wound was redressed and her Jackson-Pratt drain, which was draining very little was removed.  Postoperatively, her liver function studies remained within normal limits as did the rest of her laboratory workup.  For details, consult the chart for laboratory numbers.  Followup was arranged and she is to follow up with me a week from today, that would be January 18, 2011.  She is told to contact me should she have any acute changes or go to the emergency room should she not be able to reach me.  She is urged to go back to Dr. Sherril Croon after the perioperative period to continue her medical care.     Barbaraann Barthel, M.D.     WB/MEDQ  D:  01/11/2011  T:  01/11/2011  Job:  [id number]  cc:   Dr. Sherril Croon

## 2011-01-11 NOTE — Progress Notes (Signed)
POD # 1  Filed Vitals:   01/11/11 0659  BP: 103/62  Pulse: 63  Temp: 98.1 F (36.7 C)  Resp: 20    Doing well post op.  Wound clean and redressed.  Min. JP drainage.  Abdom. Soft and tolerated PO solids.  No dysuria leg pain or shortness of breath.  Labs OK, LFS  Normal.  Discharge and follow up arranged.  Home today.

## 2011-01-11 NOTE — Addendum Note (Signed)
Addendum  created 01/11/11 1126 by Minerva Areola, CRNA   Modules edited:Notes Section

## 2011-01-11 NOTE — Progress Notes (Signed)
Pt discharged home via family; Pt and family given and explained all discharge instructions, carenotes, and prescriptions; pt and family stated understanding and denied questions/concerns; all f/u appointments in place; IV removed without complicaitons; pt stable at time of discharge  

## 2011-01-11 NOTE — Progress Notes (Signed)
UR Chart Review Completed  

## 2011-01-14 ENCOUNTER — Encounter (HOSPITAL_COMMUNITY): Payer: Self-pay | Admitting: General Surgery

## 2012-04-17 ENCOUNTER — Encounter (HOSPITAL_COMMUNITY): Payer: Self-pay | Admitting: Pharmacy Technician

## 2012-04-20 NOTE — Patient Instructions (Signed)
BETHANEE REDONDO  04/20/2012   Your procedure is scheduled on:  05/03/12  Report to [facility] at 0930 AM.  Call this number if you have problems the morning of surgery: [phone]   Remember:   Do not eat food or drink liquids after midnight.   Take these medicines the morning of surgery with A SIP OF WATER: zantac, synthroid, HCTZ   Do not wear jewelry, make-up or nail polish.  Do not wear lotions, powders, or perfumes. You may wear deodorant.  Do not shave 48 hours prior to surgery. Men may shave face and neck.  Do not bring valuables to the hospital.  Contacts, dentures or bridgework may not be worn into surgery.  Leave suitcase in the car. After surgery it may be brought to your room.  For patients admitted to the hospital, checkout time is 11:00 AM the day of  discharge.   Patients discharged the day of surgery will not be allowed to drive  home.  Name and phone number of your driver: family  Special Instructions: N/A   Please read over the following fact sheets that you were given: Anesthesia Post-op Instructions and Care and Recovery After Surgery   PATIENT INSTRUCTIONS POST-ANESTHESIA  IMMEDIATELY FOLLOWING SURGERY:  Do not drive or operate machinery for the first twenty four hours after surgery.  Do not make any important decisions for twenty four hours after surgery or while taking narcotic pain medications or sedatives.  If you develop intractable nausea and vomiting or a severe headache please notify your doctor immediately.  FOLLOW-UP:  Please make an appointment with your surgeon as instructed. You do not need to follow up with anesthesia unless specifically instructed to do so.  WOUND CARE INSTRUCTIONS (if applicable):  Keep a dry clean dressing on the anesthesia/puncture wound site if there is drainage.  Once the wound has quit draining you may leave it open to air.  Generally you should leave the bandage intact for twenty four hours unless there is drainage.  If the  epidural site drains for more than 36-48 hours please call the anesthesia department.  QUESTIONS?:  Please feel free to call your physician or the hospital operator if you have any questions, and they will be happy to assist you.      Cataract Surgery  A cataract is a clouding of the lens of the eye. When a lens becomes cloudy, vision is reduced based on the degree and nature of the clouding. Surgery may be needed to improve vision. Surgery removes the cloudy lens and usually replaces it with a substitute lens (intraocular lens, IOL). LET YOUR EYE DOCTOR KNOW ABOUT:  Allergies to food or medicine.  Medicines taken including herbs, eyedrops, over-the-counter medicines, and creams.  Use of steroids (by mouth or creams).  Previous problems with anesthetics or numbing medicine.  History of bleeding problems or blood clots.  Previous surgery.  Other health problems, including diabetes and kidney problems.  Possibility of pregnancy, if this applies. RISKS AND COMPLICATIONS  Infection.  Inflammation of the eyeball (endophthalmitis) that can spread to both eyes (sympathetic ophthalmia).  Poor wound healing.  If an IOL is inserted, it can later fall out of proper position. This is very uncommon.  Clouding of the part of your eye that holds an IOL in place. This is called an "after-cataract." These are uncommon, but easily treated. BEFORE THE PROCEDURE  Do not eat or drink anything except small amounts of water for 8 to 12 before your  surgery, or as directed by your caregiver.  Unless you are told otherwise, continue any eyedrops you have been prescribed.  Talk to your primary caregiver about all other medicines that you take (both prescription and non-prescription). In some cases, you may need to stop or change medicines near the time of your surgery. This is most important if you are taking blood-thinning medicine.Do not stop medicines unless you are told to do so.  Arrange for  someone to drive you to and from the procedure.  Do not put contact lenses in either eye on the day of your surgery. PROCEDURE There is more than one method for safely removing a cataract. Your doctor can explain the differences and help determine which is best for you. Phacoemulsification surgery is the most common form of cataract surgery.  An injection is given behind the eye or eyedrops are given to make this a painless procedure.  A small cut (incision) is made on the edge of the clear, dome-shaped surface that covers the front of the eye (cornea).  A tiny probe is painlessly inserted into the eye. This device gives off ultrasound waves that soften and break up the cloudy center of the lens. This makes it easier for the cloudy lens to be removed by suction.  An IOL may be implanted.  The normal lens of the eye is covered by a clear capsule. Part of that capsule is intentionally left in the eye to support the IOL.  Your surgeon may or may not use stitches to close the incision. There are other forms of cataract surgery that require a larger incision and stiches to close the eye. This approach is taken in cases where the doctor feels that the cataract cannot be easily removed using phacoemulsification. AFTER THE PROCEDURE  When an IOL is implanted, it does not need care. It becomes a permanent part of your eye and cannot be seen or felt.  Your doctor will schedule follow-up exams to check on your progress.  Review your other medicines with your doctor to see which can be resumed after surgery.  Use eyedrops or take medicine as prescribed by your doctor. Document Released: 02/03/2011 Document Revised: 05/09/2011 Document Reviewed: 02/03/2011 [facility] Patient Information 2013 [place], [place].

## 2012-04-23 ENCOUNTER — Other Ambulatory Visit: Payer: Self-pay

## 2012-04-23 ENCOUNTER — Encounter (HOSPITAL_COMMUNITY): Payer: Self-pay

## 2012-04-23 ENCOUNTER — Encounter (HOSPITAL_COMMUNITY)
Admission: RE | Admit: 2012-04-23 | Discharge: 2012-04-23 | Disposition: A | Discharge status: Home / Self Care | Payer: Medicare Other | Source: Ambulatory Visit | Attending: Ophthalmology | Admitting: Ophthalmology

## 2012-04-23 LAB — HEMOGLOBIN AND HEMATOCRIT, BLOOD
HCT: 41.3 % (ref 36.0–46.0)
Hemoglobin: 13.7 g/dL (ref 12.0–15.0)

## 2012-04-23 LAB — BASIC METABOLIC PANEL
CO2: 33 mEq/L — ABNORMAL HIGH (ref 19–32)
Chloride: 101 mEq/L (ref 96–112)
Creatinine, Ser: 0.74 mg/dL (ref 0.50–1.10)
GFR calc Af Amer: >90 mL/min (ref >90)
Sodium: 141 mEq/L (ref 135–145)

## 2012-05-02 MED ORDER — LIDOCAINE HCL 3.5 % OP GEL
OPHTHALMIC | Status: AC
  Filled 2012-05-02: qty 5

## 2012-05-02 MED ORDER — PHENYLEPHRINE HCL 2.5 % OP SOLN
OPHTHALMIC | Status: AC
  Filled 2012-05-02: qty 2

## 2012-05-02 MED ORDER — NEOMYCIN-POLYMYXIN-DEXAMETH 3.5-10000-0.1 OP OINT
TOPICAL_OINTMENT | OPHTHALMIC | Status: AC
  Filled 2012-05-02: qty 3.5

## 2012-05-02 MED ORDER — LIDOCAINE HCL (PF) 1 % IJ SOLN
INTRAMUSCULAR | Status: AC
  Filled 2012-05-02: qty 2

## 2012-05-02 MED ORDER — CYCLOPENTOLATE-PHENYLEPHRINE 0.2-1 % OP SOLN
OPHTHALMIC | Status: AC
  Filled 2012-05-02: qty 2

## 2012-05-02 MED ORDER — TETRACAINE HCL 0.5 % OP SOLN
OPHTHALMIC | Status: AC
  Filled 2012-05-02: qty 2

## 2012-05-03 ENCOUNTER — Encounter (HOSPITAL_COMMUNITY): Payer: Self-pay | Admitting: *Deleted

## 2012-05-03 ENCOUNTER — Encounter (HOSPITAL_COMMUNITY)
Admission: RE | Disposition: A | Discharge status: Home / Self Care | Payer: Self-pay | Source: Ambulatory Visit | Attending: Ophthalmology

## 2012-05-03 ENCOUNTER — Ambulatory Visit (HOSPITAL_COMMUNITY): Payer: Medicare Other | Admitting: Anesthesiology

## 2012-05-03 ENCOUNTER — Encounter (HOSPITAL_COMMUNITY): Payer: Self-pay | Admitting: Anesthesiology

## 2012-05-03 ENCOUNTER — Ambulatory Visit (HOSPITAL_COMMUNITY)
Admission: RE | Admit: 2012-05-03 | Discharge: 2012-05-03 | Disposition: A | Discharge status: Home / Self Care | Payer: Medicare Other | Source: Ambulatory Visit | Attending: Ophthalmology | Admitting: Ophthalmology

## 2012-05-03 DIAGNOSIS — I10 Essential (primary) hypertension: Secondary | ICD-10-CM | POA: Insufficient documentation

## 2012-05-03 DIAGNOSIS — Z01812 Encounter for preprocedural laboratory examination: Secondary | ICD-10-CM | POA: Insufficient documentation

## 2012-05-03 DIAGNOSIS — H2510 Age-related nuclear cataract, unspecified eye: Secondary | ICD-10-CM | POA: Insufficient documentation

## 2012-05-03 DIAGNOSIS — Z01810 Encounter for preprocedural cardiovascular examination: Secondary | ICD-10-CM | POA: Insufficient documentation

## 2012-05-03 HISTORY — PX: CATARACT EXTRACTION W/PHACO: SHX586

## 2012-05-03 SURGERY — PHACOEMULSIFICATION, CATARACT, WITH IOL INSERTION
Anesthesia: Monitor Anesthesia Care | Site: Eye | Laterality: Left | Wound class: Clean

## 2012-05-03 MED ORDER — POVIDONE-IODINE 5 % OP SOLN
OPHTHALMIC | Status: DC | PRN
  Administered 2012-05-03: 1 via OPHTHALMIC

## 2012-05-03 MED ORDER — MIDAZOLAM HCL 2 MG/2ML IJ SOLN
INTRAMUSCULAR | Status: AC
  Filled 2012-05-03: qty 2

## 2012-05-03 MED ORDER — PROVISC 10 MG/ML IO SOLN
INTRAOCULAR | Status: DC | PRN
  Administered 2012-05-03: 8.5 mg via INTRAOCULAR

## 2012-05-03 MED ORDER — NEOMYCIN-POLYMYXIN-DEXAMETH 0.1 % OP OINT
TOPICAL_OINTMENT | OPHTHALMIC | Status: DC | PRN
  Administered 2012-05-03: 1 via OPHTHALMIC

## 2012-05-03 MED ORDER — EPINEPHRINE HCL 1 MG/ML IJ SOLN
INTRAOCULAR | Status: DC | PRN
  Administered 2012-05-03: 11:00:00

## 2012-05-03 MED ORDER — LIDOCAINE 3.5 % OP GEL OPTIME - NO CHARGE
OPHTHALMIC | Status: DC | PRN
  Administered 2012-05-03: 1 [drp] via OPHTHALMIC

## 2012-05-03 MED ORDER — MIDAZOLAM HCL 2 MG/2ML IJ SOLN
1.0000 mg | INTRAMUSCULAR | Status: DC | PRN
  Administered 2012-05-03: 2 mg via INTRAVENOUS

## 2012-05-03 MED ORDER — LIDOCAINE HCL 3.5 % OP GEL: 1.0000 "application " | Freq: Once | OPHTHALMIC | Status: DC

## 2012-05-03 MED ORDER — LIDOCAINE HCL (PF) 1 % IJ SOLN
INTRAMUSCULAR | Status: DC | PRN
  Administered 2012-05-03: .5 mL

## 2012-05-03 MED ORDER — EPINEPHRINE HCL 1 MG/ML IJ SOLN
INTRAMUSCULAR | Status: AC
  Filled 2012-05-03: qty 1

## 2012-05-03 MED ORDER — LACTATED RINGERS IV SOLN
INTRAVENOUS | Status: DC
  Administered 2012-05-03 (×2): via INTRAVENOUS

## 2012-05-03 MED ORDER — PHENYLEPHRINE HCL 2.5 % OP SOLN
1.0000 [drp] | OPHTHALMIC | Status: AC
  Administered 2012-05-03 (×3): 1 [drp] via OPHTHALMIC

## 2012-05-03 MED ORDER — CYCLOPENTOLATE-PHENYLEPHRINE 0.2-1 % OP SOLN
1.0000 [drp] | OPHTHALMIC | Status: AC
  Administered 2012-05-03 (×3): 1 [drp] via OPHTHALMIC

## 2012-05-03 MED ORDER — BSS IO SOLN
INTRAOCULAR | Status: DC | PRN
  Administered 2012-05-03: 15 mL via INTRAOCULAR

## 2012-05-03 MED ORDER — TETRACAINE HCL 0.5 % OP SOLN
1.0000 [drp] | OPHTHALMIC | Status: AC
  Administered 2012-05-03 (×3): 1 [drp] via OPHTHALMIC

## 2012-05-03 SURGICAL SUPPLY — 32 items

## 2012-05-03 NOTE — Preoperative (Signed)
Beta Blockers   Reason not to administer Beta Blockers:Not Applicable 

## 2012-05-03 NOTE — Transfer of Care (Signed)
Immediate Anesthesia Transfer of Care Note  Patient: [name]  Procedure(s) Performed: Procedure(s) with comments: CATARACT EXTRACTION PHACO AND INTRAOCULAR LENS PLACEMENT (IOC) (Left) - CDE: 16.80  Patient Location: Short stay  Anesthesia Type:MAC  Level of Consciousness: awake, alert , oriented and patient cooperative  Airway & Oxygen Therapy: Patient Spontanous Breathing  Post-op Assessment: Report given to PACU RN and Post -op Vital signs reviewed and stable  Post vital signs: Reviewed and stable  Complications: No apparent anesthesia complications

## 2012-05-03 NOTE — Anesthesia Postprocedure Evaluation (Signed)
  Anesthesia Post-op Note  Patient: [name]  Procedure(s) Performed: Procedure(s) with comments: CATARACT EXTRACTION PHACO AND INTRAOCULAR LENS PLACEMENT (IOC) (Left) - CDE: 16.80  Patient Location: Short Stay  Anesthesia Type:MAC  Level of Consciousness: awake, alert , oriented and patient cooperative  Airway and Oxygen Therapy: Patient Spontanous Breathing  Post-op Pain: none  Post-op Assessment: Post-op Vital signs reviewed, Patient's Cardiovascular Status Stable, Respiratory Function Stable and Patent Airway  Post-op Vital Signs: Reviewed and stable  Complications: No apparent anesthesia complications

## 2012-05-03 NOTE — Anesthesia Preprocedure Evaluation (Signed)
Anesthesia Evaluation  Patient identified by MRN, date of birth, ID band Patient awake    Reviewed: Allergy & Precautions, H&P , NPO status , Patient's Chart, lab work & pertinent test results  History of Anesthesia Complications (+) PONV  Airway Mallampati: I TM Distance: >3 FB Neck ROM: Full    Dental  (+) Teeth Intact   Pulmonary shortness of breath and with exertion,    Pulmonary exam normal       Cardiovascular hypertension, Pt. on medications + Valvular Problems/Murmurs Rhythm:Regular Rate:Normal     Neuro/Psych    GI/Hepatic GERD-  Medicated and Controlled,  Endo/Other  Hypothyroidism   Renal/GU      Musculoskeletal   Abdominal   Peds  Hematology   Anesthesia Other Findings   Reproductive/Obstetrics                           Anesthesia Physical Anesthesia Plan  ASA: II  Anesthesia Plan: MAC   Post-op Pain Management:    Induction: Intravenous  Airway Management Planned: Nasal Cannula  Additional Equipment:   Intra-op Plan:   Post-operative Plan:   Informed Consent: I have reviewed the patients History and Physical, chart, labs and discussed the procedure including the risks, benefits and alternatives for the proposed anesthesia with the patient or authorized representative who has indicated his/her understanding and acceptance.     Plan Discussed with:   Anesthesia Plan Comments:         Anesthesia Quick Evaluation

## 2012-05-03 NOTE — Anesthesia Procedure Notes (Signed)
Procedure Name: MAC Performed by: ANDRAZA, AMY L Pre-anesthesia Checklist: Patient identified, Patient being monitored, Emergency Drugs available, Timeout performed and Suction available Oxygen Delivery Method: Nasal cannula     

## 2012-05-03 NOTE — Brief Op Note (Signed)
Pre-Op Dx: Cataract OS Post-Op Dx: Cataract OS Surgeon: Deneene Tarver Anesthesia: Topical with MAC Surgery: Cataract Extraction with Intraocular lens Implant OS Implant: B&L enVista Specimen: None Complications: None 

## 2012-05-03 NOTE — H&P (Signed)
I have reviewed the H&P, the patient was re-examined, and I have identified no interval changes in medical condition and plan of care since the history and physical of record  

## 2012-05-04 NOTE — Op Note (Signed)
[name], [name]            ACCOUNT NO.:  [id number]  MEDICAL RECORD NO.:  [id number]  LOCATION:  [facility]                          FACILITY:  [facility]  PHYSICIAN:  Susanne Greenhouse, MD       DATE OF BIRTH:  April 04, 1945  DATE OF PROCEDURE:  05/03/2012 DATE OF DISCHARGE:  05/03/2012                              OPERATIVE REPORT   PREOPERATIVE DIAGNOSIS:  Nuclear cataract, left eye, diagnosis code 366.16.  POSTOPERATIVE DIAGNOSIS:  Nuclear cataract, left eye, diagnosis code 366.16.  ANESTHESIA:  Topical with monitored anesthesia care and IV sedation.  SURGEON:  Bonne Dolores. Hunt, MD  OPERATION PERFORMED:  Phacoemulsification of posterior chamber intraocular lens implantation, left eye.  OPERATIVE SUMMARY:  In the preoperative area, dilating drops were placed into the left eye.  The patient was then brought into the operating room where she was placed under topical anesthesia and IV sedation.  The eye was then prepped and draped.  Beginning with a 75 blade, a paracentesis port was made at the surgeon's 2 o'clock position.  The anterior chamber was then filled with a 1% nonpreserved lidocaine solution with epinephrine.  This was followed by Viscoat to deepen the chamber.  A small fornix-based peritomy was performed superiorly.  Next, a single iris hook was placed through the limbus superiorly.  A 2.4-mm keratome blade was then used to make a clear corneal incision over the iris hook. A bent cystotome needle and Utrata forceps were used to create a continuous tear capsulotomy.  Hydrodissection was performed using balanced salt solution on a fine cannula.  The lens nucleus was then removed using phacoemulsification in a quadrant cracking technique.  The cortical material was then removed with irrigation and aspiration.  The capsular bag and anterior chamber were refilled with Provisc.  The wound was widened to approximately 3 mm and a posterior chamber intraocular lens was placed into the  capsular bag without difficulty using an [employer] lens injecting system.  A single 10-0 nylon suture was then used to close the incision as well as stromal hydration.  The Provisc was removed from the anterior chamber and capsular bag with irrigation and aspiration.  At this point, the wounds were tested for leak, which were negative.  The anterior chamber remained deep and stable.  The patient tolerated the procedure well.  There were no operative complications, and she awoke from topical anesthesia and IV sedation without problem.  No surgical specimens.  Prosthetic device used is Bausch and Lomb, posterior chamber lens, model EnVista, model# MX60, power of 21.5, serial number is [id number].          ______________________________ Susanne Greenhouse, MD     KEH/MEDQ  D:  05/03/2012  T:  05/04/2012  Job:  [id number]

## 2012-05-08 ENCOUNTER — Encounter (HOSPITAL_COMMUNITY): Payer: Self-pay | Admitting: Ophthalmology

## 2012-05-14 ENCOUNTER — Encounter (HOSPITAL_COMMUNITY): Payer: Self-pay | Admitting: Pharmacy Technician

## 2012-05-16 MED ORDER — FENTANYL CITRATE 0.05 MG/ML IJ SOLN: 25.0000 ug | INTRAMUSCULAR | Status: DC | PRN

## 2012-05-16 MED ORDER — ONDANSETRON HCL 4 MG/2ML IJ SOLN: 4.0000 mg | Freq: Once | INTRAMUSCULAR | Status: AC | PRN

## 2012-05-17 ENCOUNTER — Encounter (HOSPITAL_COMMUNITY)
Admission: RE | Admit: 2012-05-17 | Discharge: 2012-05-17 | Disposition: A | Discharge status: Home / Self Care | Payer: Medicare Other | Source: Ambulatory Visit | Attending: Ophthalmology | Admitting: Ophthalmology

## 2012-05-17 ENCOUNTER — Encounter (HOSPITAL_COMMUNITY): Payer: Self-pay

## 2012-05-18 MED ORDER — CYCLOPENTOLATE-PHENYLEPHRINE 0.2-1 % OP SOLN
OPHTHALMIC | Status: AC
  Filled 2012-05-18: qty 2

## 2012-05-18 MED ORDER — LIDOCAINE HCL (PF) 1 % IJ SOLN
INTRAMUSCULAR | Status: AC
  Filled 2012-05-18: qty 2

## 2012-05-18 MED ORDER — LIDOCAINE HCL 3.5 % OP GEL
OPHTHALMIC | Status: AC
  Filled 2012-05-18: qty 5

## 2012-05-18 MED ORDER — TETRACAINE HCL 0.5 % OP SOLN
OPHTHALMIC | Status: AC
  Filled 2012-05-18: qty 2

## 2012-05-18 MED ORDER — NEOMYCIN-POLYMYXIN-DEXAMETH 3.5-10000-0.1 OP OINT
TOPICAL_OINTMENT | OPHTHALMIC | Status: AC
  Filled 2012-05-18: qty 3.5

## 2012-05-18 MED ORDER — PHENYLEPHRINE HCL 2.5 % OP SOLN
OPHTHALMIC | Status: AC
  Filled 2012-05-18: qty 2

## 2012-05-21 ENCOUNTER — Encounter (HOSPITAL_COMMUNITY): Payer: Self-pay | Admitting: *Deleted

## 2012-05-21 ENCOUNTER — Encounter (HOSPITAL_COMMUNITY): Payer: Self-pay | Admitting: Anesthesiology

## 2012-05-21 ENCOUNTER — Ambulatory Visit (HOSPITAL_COMMUNITY): Payer: Medicare Other | Admitting: Anesthesiology

## 2012-05-21 ENCOUNTER — Encounter (HOSPITAL_COMMUNITY)
Admission: RE | Disposition: A | Discharge status: Home / Self Care | Payer: Self-pay | Source: Ambulatory Visit | Attending: Ophthalmology

## 2012-05-21 ENCOUNTER — Ambulatory Visit (HOSPITAL_COMMUNITY)
Admission: RE | Admit: 2012-05-21 | Discharge: 2012-05-21 | Disposition: A | Discharge status: Home / Self Care | Payer: Medicare Other | Source: Ambulatory Visit | Attending: Ophthalmology | Admitting: Ophthalmology

## 2012-05-21 DIAGNOSIS — I10 Essential (primary) hypertension: Secondary | ICD-10-CM | POA: Insufficient documentation

## 2012-05-21 DIAGNOSIS — H2510 Age-related nuclear cataract, unspecified eye: Secondary | ICD-10-CM | POA: Insufficient documentation

## 2012-05-21 HISTORY — PX: CATARACT EXTRACTION W/PHACO: SHX586

## 2012-05-21 SURGERY — PHACOEMULSIFICATION, CATARACT, WITH IOL INSERTION
Anesthesia: Monitor Anesthesia Care | Site: Eye | Laterality: Right | Wound class: Clean

## 2012-05-21 MED ORDER — MIDAZOLAM HCL 2 MG/2ML IJ SOLN
INTRAMUSCULAR | Status: AC
  Filled 2012-05-21: qty 2

## 2012-05-21 MED ORDER — MIDAZOLAM HCL 2 MG/2ML IJ SOLN
1.0000 mg | INTRAMUSCULAR | Status: DC | PRN
  Administered 2012-05-21 (×2): 2 mg via INTRAVENOUS

## 2012-05-21 MED ORDER — BSS IO SOLN
INTRAOCULAR | Status: DC | PRN
  Administered 2012-05-21: 15 mL via INTRAOCULAR

## 2012-05-21 MED ORDER — PROVISC 10 MG/ML IO SOLN
INTRAOCULAR | Status: DC | PRN
  Administered 2012-05-21: 8.5 mg via INTRAOCULAR

## 2012-05-21 MED ORDER — PHENYLEPHRINE HCL 2.5 % OP SOLN
1.0000 [drp] | OPHTHALMIC | Status: AC
  Administered 2012-05-21 (×3): 1 [drp] via OPHTHALMIC

## 2012-05-21 MED ORDER — LIDOCAINE HCL 3.5 % OP GEL
1.0000 "application " | Freq: Once | OPHTHALMIC | Status: AC
  Administered 2012-05-21: 1 via OPHTHALMIC

## 2012-05-21 MED ORDER — NEOMYCIN-POLYMYXIN-DEXAMETH 0.1 % OP OINT
TOPICAL_OINTMENT | OPHTHALMIC | Status: DC | PRN
  Administered 2012-05-21: 1 via OPHTHALMIC

## 2012-05-21 MED ORDER — LIDOCAINE HCL (PF) 1 % IJ SOLN
INTRAMUSCULAR | Status: DC | PRN
  Administered 2012-05-21: .5 mL

## 2012-05-21 MED ORDER — ONDANSETRON HCL 4 MG/2ML IJ SOLN: 4.0000 mg | Freq: Once | INTRAMUSCULAR | Status: DC | PRN

## 2012-05-21 MED ORDER — FENTANYL CITRATE 0.05 MG/ML IJ SOLN: 25.0000 ug | INTRAMUSCULAR | Status: DC | PRN

## 2012-05-21 MED ORDER — POVIDONE-IODINE 5 % OP SOLN
OPHTHALMIC | Status: DC | PRN
  Administered 2012-05-21: 1 via OPHTHALMIC

## 2012-05-21 MED ORDER — EPINEPHRINE HCL 1 MG/ML IJ SOLN
INTRAOCULAR | Status: DC | PRN
  Administered 2012-05-21: 12:00:00

## 2012-05-21 MED ORDER — CYCLOPENTOLATE-PHENYLEPHRINE 0.2-1 % OP SOLN
1.0000 [drp] | OPHTHALMIC | Status: AC
  Administered 2012-05-21 (×3): 1 [drp] via OPHTHALMIC

## 2012-05-21 MED ORDER — TETRACAINE HCL 0.5 % OP SOLN
1.0000 [drp] | OPHTHALMIC | Status: AC
  Administered 2012-05-21 (×3): 1 [drp] via OPHTHALMIC

## 2012-05-21 MED ORDER — LACTATED RINGERS IV SOLN
INTRAVENOUS | Status: DC
  Administered 2012-05-21: 11:00:00 via INTRAVENOUS

## 2012-05-21 MED ORDER — EPINEPHRINE HCL 1 MG/ML IJ SOLN
INTRAMUSCULAR | Status: AC
  Filled 2012-05-21: qty 1

## 2012-05-21 SURGICAL SUPPLY — 13 items
CLOTH BEACON ORANGE TIMEOUT ST (SAFETY) ×2 IMPLANT
EYE SHIELD UNIVERSAL CLEAR (GAUZE/BANDAGES/DRESSINGS) ×2 IMPLANT
GLOVE BIOGEL PI IND STRL 6.5 (GLOVE) ×1 IMPLANT
GLOVE BIOGEL PI INDICATOR 6.5 (GLOVE) ×1
GLOVE EXAM NITRILE MD LF STRL (GLOVE) ×2 IMPLANT
GLOVE SKINSENSE NS SZ6.5 (GLOVE) ×1
GLOVE SKINSENSE STRL SZ6.5 (GLOVE) ×1 IMPLANT
PAD ARMBOARD 7.5X6 YLW CONV (MISCELLANEOUS) ×2 IMPLANT
SIGHTPATH CAT PROC W REG LENS (Ophthalmic Related) ×2 IMPLANT
SYR TB 1ML LL NO SAFETY (SYRINGE) ×2 IMPLANT
TAPE SURG TRANSPORE 1 IN (GAUZE/BANDAGES/DRESSINGS) ×1 IMPLANT
TAPE SURGICAL TRANSPORE 1 IN (GAUZE/BANDAGES/DRESSINGS) ×1
WATER STERILE IRR 250ML POUR (IV SOLUTION) ×4 IMPLANT

## 2012-05-21 NOTE — Anesthesia Preprocedure Evaluation (Signed)
Anesthesia Evaluation  Patient identified by MRN, date of birth, ID band Patient awake    Reviewed: Allergy & Precautions, H&P , NPO status , Patient's Chart, lab work & pertinent test results  History of Anesthesia Complications (+) PONV  Airway Mallampati: I TM Distance: >3 FB Neck ROM: Full    Dental  (+) Teeth Intact   Pulmonary shortness of breath and with exertion,    Pulmonary exam normal       Cardiovascular hypertension, Pt. on medications + Valvular Problems/Murmurs Rhythm:Regular Rate:Normal     Neuro/Psych    GI/Hepatic GERD-  Medicated and Controlled,  Endo/Other  Hypothyroidism   Renal/GU      Musculoskeletal   Abdominal   Peds  Hematology   Anesthesia Other Findings   Reproductive/Obstetrics                           Anesthesia Physical Anesthesia Plan  ASA: II  Anesthesia Plan: MAC   Post-op Pain Management:    Induction:   Airway Management Planned: Nasal Cannula  Additional Equipment:   Intra-op Plan:   Post-operative Plan:   Informed Consent: I have reviewed the patients History and Physical, chart, labs and discussed the procedure including the risks, benefits and alternatives for the proposed anesthesia with the patient or authorized representative who has indicated his/her understanding and acceptance.     Plan Discussed with: CRNA  Anesthesia Plan Comments:         Anesthesia Quick Evaluation

## 2012-05-21 NOTE — Op Note (Signed)
Date of Admission: Today  Date of Surgery: Today  Pre-Op Dx: Cataract  Right  Eye  Post-Op Dx: Nuclear Cataract  Right  Eye, Dx Code 366.16  Surgeon: Treasure Ingrum, M.D.  Assistants: None  Anesthesia: Topical with MAC  Indications: Painless, progressive loss of vision with compromise of daily activities.  Surgery: Cataract Extraction with Intraocular lens Implant Right Eye  Discription: The patient had dilating drops and viscous lidocaine placed into the left eye in the pre-op holding area. After transfer to the operating room, a time out was performed. The patient was then prepped and draped. Beginning with a 75 degree blade a paracentesis port was made at the surgeon's 2 o'clock position. The anterior chamber was then filled with 2% non-preserved lidocaine. This was followed by filling the anterior chamber with Provisc. A bent cystatome needle was used to create a continuous tear capsulotomy. Hydrodissection was performed with balanced salt solution on a Fine canula. The lens nucleus was then removed using the phacoemulsification handpiece. Residual cortex was removed with the I&A handpiece. The anterior chamber and capsular bag were refilled with Provisc. A posterior chamber intraocular lens was placed into the capsular bag with it's injector. The implant was positioned with the Kuglan hook. The Provisc was then removed from the anterior chamber and capsular bag with the I&A handpiece. Stromal hydration of the main incision and paracentesis port was performed with BSS on a Fine canula. The wounds were tested for leak which was negative. The patient tolerated the procedure well. There were no operative complications. The patient was then transferred to the recovery room in stable condition.  Prosthetic device:  B&L enVista MX60, power 21.5D.  Specimen: None  EBL: None  Complications: None 

## 2012-05-21 NOTE — Transfer of Care (Signed)
Immediate Anesthesia Transfer of Care Note  Patient: [name]  Procedure(s) Performed: Procedure(s) with comments: CATARACT EXTRACTION PHACO AND INTRAOCULAR LENS PLACEMENT (IOC) (Right) - CDE=12.31  Patient Location: PACU and Short Stay  Anesthesia Type:MAC  Level of Consciousness: awake  Airway & Oxygen Therapy: Patient Spontanous Breathing  Post-op Assessment: Post -op Vital signs reviewed and stable  Post vital signs: Reviewed and stable  Complications: No apparent anesthesia complications

## 2012-05-21 NOTE — H&P (Signed)
I have reviewed the H&P, the patient was re-examined, and I have identified no interval changes in medical condition and plan of care since the history and physical of record  

## 2012-05-21 NOTE — Anesthesia Postprocedure Evaluation (Signed)
  Anesthesia Post-op Note  Patient: [name]  Procedure(s) Performed: Procedure(s) with comments: CATARACT EXTRACTION PHACO AND INTRAOCULAR LENS PLACEMENT (IOC) (Right) - CDE=12.31  Patient Location: PACU and Short Stay  Anesthesia Type:MAC  Level of Consciousness: awake, alert  and oriented  Airway and Oxygen Therapy: Patient Spontanous Breathing  Post-op Pain: none  Post-op Assessment: Post-op Vital signs reviewed, Patient's Cardiovascular Status Stable, Respiratory Function Stable, Patent Airway and No signs of Nausea or vomiting  Post-op Vital Signs: Reviewed and stable  Complications: No apparent anesthesia complications

## 2012-05-23 ENCOUNTER — Encounter (HOSPITAL_COMMUNITY): Payer: Self-pay | Admitting: Ophthalmology

## 2012-12-12 ENCOUNTER — Telehealth: Payer: Self-pay

## 2012-12-12 NOTE — Telephone Encounter (Signed)
Pt called and would like to change from NUR to RMR. She was last seen at [facility] office in June 2012. Pt's grandson come here to RMR and that is why she would like to change. Please advise

## 2012-12-21 NOTE — Telephone Encounter (Addendum)
Pt is aware of her appointment on Nov 5 at 1:30

## 2013-01-02 ENCOUNTER — Encounter (INDEPENDENT_AMBULATORY_CARE_PROVIDER_SITE_OTHER): Payer: Self-pay

## 2013-01-02 ENCOUNTER — Ambulatory Visit (INDEPENDENT_AMBULATORY_CARE_PROVIDER_SITE_OTHER): Payer: Medicare Other | Admitting: Gastroenterology

## 2013-01-02 ENCOUNTER — Encounter: Payer: Self-pay | Admitting: Gastroenterology

## 2013-01-02 VITALS — BP 128/72 | HR 111 | Temp 98.3°F | Wt 190.8 lb

## 2013-01-02 DIAGNOSIS — K589 Irritable bowel syndrome without diarrhea: Secondary | ICD-10-CM

## 2013-01-02 DIAGNOSIS — K219 Gastro-esophageal reflux disease without esophagitis: Secondary | ICD-10-CM | POA: Insufficient documentation

## 2013-01-02 DIAGNOSIS — E039 Hypothyroidism, unspecified: Secondary | ICD-10-CM | POA: Insufficient documentation

## 2013-01-02 LAB — CBC
HCT: 42.2 % (ref 36.0–46.0)
MCV: 83.9 fL (ref 78.0–100.0)
RBC: 5.03 MIL/uL (ref 3.87–5.11)
WBC: 6.9 10*3/uL (ref 4.0–10.5)

## 2013-01-02 MED ORDER — OMEPRAZOLE 20 MG PO CPDR: 20.0000 mg | DELAYED_RELEASE_CAPSULE | Freq: Every day | ORAL | Status: DC

## 2013-01-02 NOTE — Patient Instructions (Signed)
For reflux: stop Zantac. Start taking Prilosec once each morning, 30 minutes before breakfast. We have provided samples and sent to the pharmacy.  For irritable bowel syndrome, gas, bloating: start taking a probiotic like Digestive Advantage daily. Add Metamucil supplements daily. Avoid fatty, greasy, fried foods.   Please have blood work done and complete stool sample.  We will see you again in 6 weeks!

## 2013-01-02 NOTE — Progress Notes (Signed)
Primary Care Physician:  Ignatius Specking., MD Primary Gastroenterologist:  Dr. Jena Gauss   Chief Complaint  Patient presents with  . Abdominal Pain    HPI:   [name] presents today secondary to abdominal pain. Previously seen by Dr. Karilyn Cota in 2012. Hx of IBS. Last colonoscopy in 2011 by Dr. Lovell Sheehan, due for surveillance in Aug 2016. Last EGD in 2011 by Dr. Lovell Sheehan with multiple gastric polyps, path benign.  Notes significant gas, cramping, diarrhea, and constipation. Seems diarrhea is more predominant. Hits her at [employer] before even finishing eating. Griping pain. Sometimes nausea and vomiting until it gets "broke loose". No relief until all emptied out. Significant gas, even without eating. Nausea occasionally in the morning. +bloating. Worsened with fried foods. Feels like symptoms are worse than her baseline.   2 BMs today, normal. Yesterday loose stool. Takes Miralax as needed each evening. Takes MOM at times if significant constipation. No rectal bleeding. No weight loss. No dysphagia. +reflux, on Zantac. Takes Alka-Seltzer as needed.   Past Medical History  Diagnosis Date  . Hyperlipidemia   . Heart murmur   . Shortness of breath     with exertion  . Hypothyroidism   . Arthritis   . GERD (gastroesophageal reflux disease)   . IBS (irritable bowel syndrome)     Past Surgical History  Procedure Laterality Date  . Cesarean section  1967  . Abdominal hysterectomy  2003  . Tubal ligation  1970's  . Colonoscopy  2011    Dr. Lovell Sheehan: sigmoid diverticulosis, repeat Aug 2016 due to history of adenomatous polyps  . Esophagogastroduodenoscopy  2011    Dr. Lovell Sheehan: multiple gastric polyps, path with mild chronic gastritis  . Cholecystectomy  01/10/2011    Procedure: LAPAROSCOPIC CHOLECYSTECTOMY;  Surgeon: Marlane Hatcher;  Location: [facility];  Service: General;  Laterality: N/A;  . Cataract extraction w/phaco Left 05/03/2012    Procedure: CATARACT EXTRACTION PHACO AND  INTRAOCULAR LENS PLACEMENT (IOC);  Surgeon: Gemma Payor, MD;  Location: [facility];  Service: Ophthalmology;  Laterality: Left;  CDE: 16.80  . Cataract extraction w/phaco Right 05/21/2012    Procedure: CATARACT EXTRACTION PHACO AND INTRAOCULAR LENS PLACEMENT (IOC);  Surgeon: Gemma Payor, MD;  Location: [facility];  Service: Ophthalmology;  Laterality: Right;  CDE=12.31    Current Outpatient Prescriptions  Medication Sig Dispense Refill  . hydrochlorothiazide (HYDRODIURIL) 25 MG tablet Take 25 mg by mouth daily.        Marland Kitchen levothyroxine (SYNTHROID, LEVOTHROID) 88 MCG tablet Take 88 mcg by mouth daily.        . naproxen sodium (ALEVE) 220 MG tablet Take 220 mg by mouth 2 (two) times daily as needed (Pain).      . polyethylene glycol (MIRALAX / GLYCOLAX) packet Take 17 g by mouth daily.        . propranolol (INDERAL) 80 MG tablet Take 80 mg by mouth daily.      . ranitidine (ZANTAC) 150 MG tablet Take 150 mg by mouth 2 (two) times daily. Acid reflex        No current facility-administered medications for this visit.    Allergies as of 01/02/2013 - Review Complete 01/02/2013  Allergen Reaction Noted  . Metoclopramide hcl Other (See Comments) 01/04/2011    Family History  Problem Relation Age of Onset  . Anesthesia problems Neg Hx   . Hypotension Neg Hx   . Malignant hyperthermia Neg Hx   . Pseudochol deficiency Neg Hx   . Colon cancer  Neg Hx     History   Social History  . Marital Status: Married    Spouse Name: N/A    Number of Children: N/A  . Years of Education: N/A   Occupational History  . Not on file.   Social History Main Topics  . Smoking status: Never Smoker   . Smokeless tobacco: Not on file  . Alcohol Use: No  . Drug Use: No  . Sexual Activity: Yes    Birth Control/ Protection: None   Other Topics Concern  . Not on file   Social History Narrative  . No narrative on file    Review of Systems: Gen: Denies any fever, chills, fatigue, weight loss, lack of appetite.   CV: denies chest pain, palpitations Resp: +DOE GI: see HPI GU : Denies urinary burning, urinary frequency, urinary hesitancy MS: +arthritis Derm: Denies rash, itching, dry skin Psych: Denies depression, anxiety, memory loss, and confusion Heme: Denies bruising, bleeding, and enlarged lymph nodes.  Physical Exam: BP 128/72  Pulse 111  Temp(Src) 98.3 F (36.8 C) (Oral)  Wt 190 lb 12.8 oz (86.546 kg) General:   Alert and oriented. Pleasant and cooperative. Well-nourished and well-developed.  Head:  Normocephalic and atraumatic. Eyes:  Without icterus, sclera clear and conjunctiva pink.  Ears:  Normal auditory acuity. Nose:  No deformity, discharge,  or lesions. Mouth:  No deformity or lesions, oral mucosa pink.  Neck:  Supple, without mass or thyromegaly. Lungs:  Clear to auscultation bilaterally. No wheezes, rales, or rhonchi. No distress.  Heart:  S1, S2 present with systolic murmur appreciated Abdomen:  +BS, soft, non-tender and non-distended. No HSM noted. Likely umbilical hernia noted Rectal:  Deferred  Msk:  Symmetrical without gross deformities. Normal posture. Extremities:  Without clubbing or edema. Neurologic:  Alert and  oriented x4;  grossly normal neurologically. Skin:  Intact without significant lesions or rashes. Cervical Nodes:  No significant cervical adenopathy. Psych:  Alert and cooperative. Normal mood and affect.

## 2013-01-02 NOTE — Assessment & Plan Note (Signed)
TSH today 

## 2013-01-02 NOTE — Assessment & Plan Note (Signed)
Historically has dealt with both constipation and diarrhea; now notes diarrhea predominant but worsened by fatty, greasy foods. Seems to be significantly related to dietary intake. Colonoscopy up-to-date. Associated gas, bloating, occasional nausea with bad episodes.   Recommend avoidance of trigger foods. Obtain ifobt: proceed with TCS if positive, as it has been over 3 years, and she has a history of adenomatous polyps in the remote past Trial probiotic: consider trying Bentyl again, but she has experienced constipation with this in the past Supplemental fiber daily CBC, TSH, celiac serologies 6 week return

## 2013-01-02 NOTE — Assessment & Plan Note (Signed)
Continued symptoms despite Zantac. Trial Prilosec. Nausea may be secondary to gastritis, GERD. 6 week return.

## 2013-01-03 LAB — IGA: IgA: 189 mg/dL (ref 69–380)

## 2013-01-03 LAB — TISSUE TRANSGLUTAMINASE, IGA: Tissue Transglutaminase Ab, IgA: 7.3 U/mL (ref <20)

## 2013-01-07 ENCOUNTER — Ambulatory Visit (INDEPENDENT_AMBULATORY_CARE_PROVIDER_SITE_OTHER): Payer: Medicare Other | Admitting: Gastroenterology

## 2013-01-07 DIAGNOSIS — K589 Irritable bowel syndrome without diarrhea: Secondary | ICD-10-CM

## 2013-01-07 LAB — IFOBT (OCCULT BLOOD): IFOBT: NEGATIVE

## 2013-01-07 NOTE — Progress Notes (Signed)
cc'd to pcp 

## 2013-01-07 NOTE — Progress Notes (Signed)
Pt return IFOBT and it was positive.

## 2013-01-09 NOTE — Progress Notes (Signed)
That's ok.  So it is definitely positive.  Just wanted to clarify.

## 2013-01-09 NOTE — Progress Notes (Signed)
Sorry I could not change it under the result tab. It was POSITIVE.

## 2013-01-09 NOTE — Progress Notes (Signed)
This states ifobt is positive, but under results it says it is negative. Which one?  

## 2013-01-11 ENCOUNTER — Other Ambulatory Visit: Payer: Self-pay | Admitting: Internal Medicine

## 2013-01-11 DIAGNOSIS — R195 Other fecal abnormalities: Secondary | ICD-10-CM

## 2013-01-11 DIAGNOSIS — R110 Nausea: Secondary | ICD-10-CM

## 2013-01-11 DIAGNOSIS — K219 Gastro-esophageal reflux disease without esophagitis: Secondary | ICD-10-CM

## 2013-01-11 DIAGNOSIS — K589 Irritable bowel syndrome without diarrhea: Secondary | ICD-10-CM

## 2013-01-11 MED ORDER — PEG 3350-KCL-NA BICARB-NACL 420 G PO SOLR: 4000.0000 mL | ORAL | Status: DC

## 2013-01-14 NOTE — Progress Notes (Addendum)
CBC, TSH, celiac serologies normal. Nausea not improved with PPI. ifobt positive. Proceed with TCS+/- EGD with RMR.

## 2013-01-28 ENCOUNTER — Encounter (HOSPITAL_COMMUNITY): Payer: Self-pay | Admitting: Pharmacy Technician

## 2013-02-04 ENCOUNTER — Ambulatory Visit (HOSPITAL_COMMUNITY)
Admission: RE | Admit: 2013-02-04 | Discharge: 2013-02-04 | Disposition: A | Discharge status: Home / Self Care | Payer: Medicare Other | Source: Ambulatory Visit | Attending: Internal Medicine | Admitting: Internal Medicine

## 2013-02-04 ENCOUNTER — Encounter (HOSPITAL_COMMUNITY)
Admission: RE | Disposition: A | Discharge status: Home / Self Care | Payer: Self-pay | Source: Ambulatory Visit | Attending: Internal Medicine

## 2013-02-04 ENCOUNTER — Encounter (HOSPITAL_COMMUNITY): Payer: Self-pay | Admitting: *Deleted

## 2013-02-04 DIAGNOSIS — R110 Nausea: Secondary | ICD-10-CM | POA: Insufficient documentation

## 2013-02-04 DIAGNOSIS — K449 Diaphragmatic hernia without obstruction or gangrene: Secondary | ICD-10-CM | POA: Insufficient documentation

## 2013-02-04 DIAGNOSIS — Z860100 Personal history of colon polyps, unspecified: Secondary | ICD-10-CM | POA: Insufficient documentation

## 2013-02-04 DIAGNOSIS — K5730 Diverticulosis of large intestine without perforation or abscess without bleeding: Secondary | ICD-10-CM

## 2013-02-04 DIAGNOSIS — R195 Other fecal abnormalities: Secondary | ICD-10-CM

## 2013-02-04 DIAGNOSIS — K219 Gastro-esophageal reflux disease without esophagitis: Secondary | ICD-10-CM

## 2013-02-04 DIAGNOSIS — K2940 Chronic atrophic gastritis without bleeding: Secondary | ICD-10-CM | POA: Insufficient documentation

## 2013-02-04 DIAGNOSIS — K589 Irritable bowel syndrome without diarrhea: Secondary | ICD-10-CM

## 2013-02-04 DIAGNOSIS — D131 Benign neoplasm of stomach: Secondary | ICD-10-CM | POA: Insufficient documentation

## 2013-02-04 DIAGNOSIS — K921 Melena: Secondary | ICD-10-CM | POA: Insufficient documentation

## 2013-02-04 HISTORY — PX: COLONOSCOPY: SHX5424

## 2013-02-04 HISTORY — PX: ESOPHAGOGASTRODUODENOSCOPY: SHX5428

## 2013-02-04 SURGERY — COLONOSCOPY
Anesthesia: Moderate Sedation

## 2013-02-04 MED ORDER — MIDAZOLAM HCL 5 MG/5ML IJ SOLN
INTRAMUSCULAR | Status: DC | PRN
  Administered 2013-02-04: 1 mg via INTRAVENOUS
  Administered 2013-02-04 (×2): 2 mg via INTRAVENOUS
  Administered 2013-02-04 (×2): 1 mg via INTRAVENOUS

## 2013-02-04 MED ORDER — SODIUM CHLORIDE 0.9 % IV SOLN
INTRAVENOUS | Status: DC
  Administered 2013-02-04: 08:00:00 via INTRAVENOUS

## 2013-02-04 MED ORDER — BUTAMBEN-TETRACAINE-BENZOCAINE 2-2-14 % EX AERO
INHALATION_SPRAY | CUTANEOUS | Status: DC | PRN
  Administered 2013-02-04: 2 via TOPICAL

## 2013-02-04 MED ORDER — MIDAZOLAM HCL 5 MG/5ML IJ SOLN
INTRAMUSCULAR | Status: AC
  Filled 2013-02-04: qty 10

## 2013-02-04 MED ORDER — STERILE WATER FOR IRRIGATION IR SOLN
Status: DC | PRN
  Administered 2013-02-04: 09:00:00

## 2013-02-04 MED ORDER — MEPERIDINE HCL 100 MG/ML IJ SOLN
INTRAMUSCULAR | Status: DC | PRN
  Administered 2013-02-04 (×2): 25 mg via INTRAVENOUS
  Administered 2013-02-04: 50 mg via INTRAVENOUS
  Administered 2013-02-04: 25 mg via INTRAVENOUS

## 2013-02-04 MED ORDER — ONDANSETRON HCL 4 MG/2ML IJ SOLN
INTRAMUSCULAR | Status: AC
  Filled 2013-02-04: qty 2

## 2013-02-04 MED ORDER — MEPERIDINE HCL 100 MG/ML IJ SOLN
INTRAMUSCULAR | Status: AC
  Filled 2013-02-04: qty 2

## 2013-02-04 NOTE — Op Note (Addendum)
[facility] [address] [place], [address]   COLONOSCOPY PROCEDURE REPORT  PATIENT: [name], [name]  MR#:         [id number] BIRTHDATE: 24-Jun-1945 , 67  yrs. old GENDER: Female ENDOSCOPIST: R.  Roetta Sessions, MD FACP FACG REFERRED BY:  Doreen Beam, M.D. PROCEDURE DATE:  02/04/2013 PROCEDURE:     Diagnostic colonoscopy  INDICATIONS: Hemoccult-positive stool.; Normal CBC; history of colonic adenoma  INFORMED CONSENT:  The risks, benefits, alternatives and imponderables including but not limited to bleeding, perforation as well as the possibility of a missed lesion have been reviewed.  The potential for biopsy, lesion removal, etc. have also been discussed.  Questions have been answered.  All parties agreeable. Please see the history and physical in the medical record for more information.  MEDICATIONS: Versed 6 mg IV and Demerol 100 mg IV in divided doses. Zofran 4 mg IV.  DESCRIPTION OF PROCEDURE:  After a digital rectal exam was performed, the EC-3890Li ([id number])  colonoscope was advanced from the anus through the rectum and colon to the area of the cecum, ileocecal valve and appendiceal orifice.  The cecum was deeply intubated.  These structures were well-seen and photographed for the record.  From the level of the cecum and ileocecal valve, the scope was slowly and cautiously withdrawn.  The mucosal surfaces were carefully surveyed utilizing scope tip deflection to facilitate fold flattening as needed.  The scope was pulled down into the rectum where a thorough examination including retroflexion was performed.    FINDINGS:  Adequate preparation. Normal rectum. Scattered pancolonic diverticula; the remainder of the colonic mucosa appeared normal.  THERAPEUTIC / DIAGNOSTIC MANEUVERS PERFORMED:  None  COMPLICATIONS: none  CECAL WITHDRAWAL TIME:  9 minutes  IMPRESSION:  Colonic diverticulosis  RECOMMENDATIONS: Repeat colonoscopy in 5 years. See  EGD report   _______________________________ eSigned:  R. Roetta Sessions, MD FACP [facility] 02/04/2013 9:43 AM   CC:

## 2013-02-04 NOTE — H&P (View-Only) (Signed)
This states ifobt is positive, but under results it says it is negative. Which one?

## 2013-02-04 NOTE — Interval H&P Note (Signed)
History and Physical Interval Note:  02/04/2013 9:03 AM  Gardiner Fanti  has presented today for surgery, with the diagnosis of Heme positive Stool and Nausea, IBS AND GERD  The various methods of treatment have been discussed with the patient and family. After consideration of risks, benefits and other options for treatment, the patient has consented to  Procedure(s) with comments: COLONOSCOPY (N/A) - 11:00-moved to [address] notified pt ESOPHAGOGASTRODUODENOSCOPY (EGD) (N/A) as a surgical intervention .  The patient's history has been reviewed, patient examined, no change in status, stable for surgery.  I have reviewed the patient's chart and labs.  Questions were answered to the patient's satisfaction.     No change. GERD almost daily. CBC, celiac screen, TSH negative/normal. Colonoscopy with EGD today per plan.The risks, benefits, limitations, imponderables and alternatives regarding both EGD and colonoscopy have been reviewed with the patient. Questions have been answered. All parties agreeable.    Eula Listen

## 2013-02-04 NOTE — Op Note (Addendum)
[facility] [address] [place], [address]   ENDOSCOPY PROCEDURE REPORT  PATIENT: Marie, Whitmire  MR#: [id number] BIRTHDATE: 21-Nov-1945 , 67  yrs. old GENDER: Female ENDOSCOPIST: R.  Roetta Sessions, MD FACP FACG REFERRED BY:  Doreen Beam, M.D. PROCEDURE DATE:  02/04/2013 PROCEDURE:     EGD with gastric biopsy  INDICATIONS:      Daily GERD; Hemoccult-positive stool; frequent nausea; negative colonoscopy  INFORMED CONSENT:   The risks, benefits, limitations, alternatives and imponderables have been discussed.  The potential for biopsy, esophogeal dilation, etc. have also been reviewed.  Questions have been answered.  All parties agreeable.  Please see the history and physical in the medical record for more information.  MEDICATIONS: Versed 7 mg IV and Demerol 125 mg IV in divided doses. Zofran 4 mg IV. Cetacaine spray  DESCRIPTION OF PROCEDURE:   The EC-3890Li ([id number]) and EG-[id number] ([id number])  endoscope was introduced through the mouth and advanced to the second portion of the duodenum without difficulty or limitations.  The mucosal surfaces were surveyed very carefully during advancement of the scope and upon withdrawal.  Retroflexion view of the proximal stomach and esophagogastric junction was performed.      FINDINGS:    somewhat accentuated undulating Z line otherwise, normal esophagus. No Barrett's esophagus. No esophagitis. Stomach empty. Small hiatal hernia. Multiple 1-3 mm benign-appearing gastric polyps. No ulcer or infiltrating process. Patent pylorus. Normal first and second portion of the duodenum.  THERAPEUTIC / DIAGNOSTIC MANEUVERS PERFORMED:  Gastric polyp and gastric mucosa biopsied   COMPLICATIONS:  None  IMPRESSION:  Small hiatal hernia. Gastric polyps  -   status post biopsy  RECOMMENDATIONS:     Begin Protonix 40 mgdaily.  See colonoscopy report.  Begin [id number] for constipation.  Followup on  pathology    _______________________________ R. Roetta Sessions, MD FACP [facility] eSigned:  R. Roetta Sessions, MD FACP [facility] 02/04/2013 9:55 AM     CC:

## 2013-02-07 ENCOUNTER — Encounter (HOSPITAL_COMMUNITY): Payer: Self-pay | Admitting: Internal Medicine

## 2013-02-10 ENCOUNTER — Encounter: Payer: Self-pay | Admitting: Internal Medicine

## 2013-02-13 ENCOUNTER — Ambulatory Visit: Payer: Medicare Other | Admitting: Gastroenterology

## 2013-03-12 ENCOUNTER — Ambulatory Visit (INDEPENDENT_AMBULATORY_CARE_PROVIDER_SITE_OTHER): Payer: Medicare Other | Admitting: Gastroenterology

## 2013-03-12 ENCOUNTER — Encounter: Payer: Self-pay | Admitting: Gastroenterology

## 2013-03-12 ENCOUNTER — Encounter (INDEPENDENT_AMBULATORY_CARE_PROVIDER_SITE_OTHER): Payer: Self-pay

## 2013-03-12 VITALS — BP 113/65 | HR 61 | Temp 98.4°F | Wt 195.4 lb

## 2013-03-12 DIAGNOSIS — K219 Gastro-esophageal reflux disease without esophagitis: Secondary | ICD-10-CM

## 2013-03-12 DIAGNOSIS — K589 Irritable bowel syndrome without diarrhea: Secondary | ICD-10-CM

## 2013-03-12 MED ORDER — LINACLOTIDE 145 MCG PO CAPS: 145.0000 ug | ORAL_CAPSULE | Freq: Every day | ORAL | Status: DC

## 2013-03-12 NOTE — Progress Notes (Signed)
cc'd to pcp 

## 2013-03-12 NOTE — Patient Instructions (Addendum)
Stop Protonix. Continue taking Prilosec 20 mg once each morning, 30 minutes before breakfast.   I have given a voucher for Linzess 145 mcg capsules. This will give you a month supply free. Please let me know how this works for you.   Return in 3 months.   Lactose-Free Diet Lactose is a carbohydrate that is found mainly in milk and milk products, as well as in foods with added milk or whey. Lactose must be digested by the enzyme lactase in order to be used by the body. Lactose intolerance occurs when there is a shortage of lactase. When your body is not able to digest lactose, you may feel sick to your stomach (nausea), bloated, and have cramps, gas, and diarrhea. TYPES OF LACTASE DEFICIENCY  Primary lactase deficiency. This is the most common type. It is characterized by a slow decrease in lactase activity.  Secondary lactase deficiency. This occurs following injury to the small intestinal mucosa as a result of a disease or condition. It can also occur as a result of surgery or after treatment with antibiotic medicines or cancer drugs. Tolerance to lactose varies widely. Each person must determine how much milk can be consumed without developing symptoms. Drinking smaller portions of milk throughout the day may be helpful. Some studies suggest that slowing gastric emptying may help increase tolerance of milk products. This may be done by:  Consuming milk or milk products with a meal rather than alone.  Consuming milk with a higher fat content. There are many dairy products that may be tolerated better than milk by some people, including:  Cheese (especially aged cheese). The lactose content is much lower than in milk.  Cultured dairy products, such as yogurt, buttermilk, cottage cheese, and sweet acidophilus milk (kefir). These products are usually well tolerated by lactase-deficient people. This is because the healthy bacteria help digest lactose.  Lactose-hydrolyzed milk. This product  contains 40% to 90% less lactose than milk and may also be well tolerated. ADEQUACY These diets may be deficient in calcium, riboflavin, and vitamin D, according to the Recommended Dietary Allowances of the [employer]. Depending on individual tolerances and the use of milk substitutes, milk, or other dairy products, you may be able to meet these recommendations. SPECIAL NOTES  Lactose is a carbohydrate. The main food source for lactose is dairy products. Reading food labels is important. Many products contain lactose even when they are not made from milk. Look for the following words: whey, milk solids, dry milk solids, nonfat dry milk powder. Typical sources of lactose other than dairy products include breads, candies, cold cuts, prepared and processed foods, and commercial sauces and gravies.  All foods must be prepared without milk, cream, or other dairy foods.  A vitamin or mineral supplement may be necessary. Consult your caregiver or Registered Dietitian.  Lactose is also found in many prescription and over-the-counter medicines.  Soy milk and lactose-free supplements may be used as an alternative to milk. CHOOSING FOODS Breads and Starches  Allowed: Breads and rolls made without milk. [place], [place], or [place] bread. Soda crackers, graham crackers. Any crackers prepared without lactose. Cooked or dry cereals prepared without lactose (read labels). Any potatoes, pasta, or rice prepared without milk or lactose. Popcorn.  Avoid: Breads and rolls that contain milk. Prepared mixes such as muffins, biscuits, waffles, pancakes. Sweet rolls, donuts, [place] toast (if made with milk or lactose). Zwieback crackers, corn curls, or any crackers that contain lactose. Cooked or dry cereals prepared with lactose (  read labels). Instant potatoes, frozen [place] fries, scalloped or au gratin potatoes. Vegetables  Allowed: Fresh, frozen, and canned vegetables.  Avoid: Creamed or breaded  vegetables. Vegetables in a cheese sauce or with lactose-containing margarines. Fruit  Allowed: All fresh, canned, or frozen fruits that are not processed with lactose.  Avoid: Any canned or frozen fruits processed with lactose. Meat and Meat Substitutes  Allowed: Plain beef, chicken, fish, [place], lamb, veal, pork, or ham. Kosher prepared meat products. Strained or junior meats that do not contain milk. Eggs, soy meat substitutes, nuts.  Avoid: Scrambled eggs, omelets, and souffles that contain milk. Creamed or breaded meat, fish, or fowl. Sausage products such as wieners, liver sausage, or cold cuts that contain milk solids. Cheese, cottage cheese, or cheese spreads. Milk  Allowed: None.  Avoid: Milk (whole, 2%, skim, or chocolate). Evaporated, powdered, or condensed milk. Malted milk. Soups and Combination Foods  Allowed: Bouillon, broth, vegetable soups, clear soups, consomms. Homemade soups made with allowed ingredients. Combination or prepared foods that do not contain milk or milk products (read labels).  Avoid: Cream soups, chowders, commercially prepared soups containing lactose. Macaroni and cheese, pizza. Combination or prepared foods that contain milk or milk products. Desserts and Sweets  Allowed: Water and fruit ices, gelatin, angel food cake. Homemade cookies, pies, or cakes made from allowed ingredients. Pudding (if made with water or a milk substitute). Lactose-free tofu desserts. Sugar, honey, corn syrup, jam, jelly, marmalade, molasses (beet sugar). Pure sugar candy, marshmallows.  Avoid: Ice cream, ice milk, sherbet, custard, pudding, frozen yogurt. Commercial cake and cookie mixes. Desserts that contain chocolate. Pie crust made with milk-containing margarine. Reduced calorie desserts made with a sugar substitute that contains lactose. Toffee, peppermint, butterscotch, chocolate, caramels. Fats and Oils  Allowed: Butter (as tolerated, contains very small amounts of  lactose). Margarines and dressings that do not contain milk. Vegetable oils, shortening, mayonnaise, nondairy cream and whipped toppings without lactose or milk solids added. Berniece Salines.  Avoid: Margarines and salad dressings containing milk. Cream, cream cheese, peanut butter with added milk solids, sour cream, chip dips made with sour cream. Beverages  Allowed: Carbonated drinks, tea, coffee and freeze-dried coffee, some instant coffees (check labels). Fruit drinks, fruit and vegetable juice, rice or soy milk.  Avoid: Hot chocolate. Some cocoas, some instant coffees, instant iced teas, powdered fruit drinks (read labels). Condiments  Allowed: Soy sauce, carob powder, olives, gravy made with water, baker's cocoa, pickles, pure seasonings and spices, wine, pure monosodium glutamate, catsup, mustard.  Avoid: Some chewing gums, chocolate, some cocoas. Certain antibiotics and vitamin or mineral preparations. Spice blends if they contain milk products. MSG extender. Artificial sweeteners that contain lactose. Some nondairy creamers (read labels). SAMPLE MENU Breakfast  Orange juice.  Banana.  Bran cereal.  Nondairy creamer.  [place]ienna bread, toasted.  Butter or milk-free margarine.  Coffee or tea. Lunch  Chicken breast.  Rice.  Green beans.  Butter or milk-free margarine.  Fresh melon.  Coffee or tea. Dinner  [employer].  Baked potato.  Butter or milk-free margarine.  Broccoli.  Lettuce salad with vinegar and oil dressing.  [employer]  Coffee or tea. Document Released: 08/06/2001 Document Revised: 05/09/2011 Document Reviewed: 05/14/2010 [facility] Patient Information 2014 [place], [place].

## 2013-03-12 NOTE — Assessment & Plan Note (Signed)
Improved with Linzess 145 mcg daily; heme positive stool on file but normal CBC, normal TCS and EGD. No further investigation unless evidence of anemia.   Provide Linzess 145 mcg voucher for 1 month free supply Linzess savings card provided: patient is cash-pay for prescription costs Lactose-free trial for "gas". Gas diet handout provided. Likely will improve with more aggressive bowel regimen Return in 3 months.

## 2013-03-12 NOTE — Progress Notes (Signed)
Referring Provider: Glenda Chroman., MD Primary Care Physician:  Glenda Chroman., MD Primary GI: Dr. Gala Romney   Chief Complaint  Patient presents with  . Follow-up  . Gas    HPI:   [name] presents today in follow-up after EGD and colonoscopy performed secondary to heme positive stool, nausea. Findings normal. CBC, TSH, celiac serologies normal.  Nausea improved. Had been taking Protonix and Prilosec daily. Was unaware she was only supposed to take one. Did well on Linzess 145 mcg daily. Main concern is gas. Takes Gas-X with decent results. Only has abdominal discomfort if constipated. Probiotic daily. Eats cheese. Some dairy products but no milk. Occasional ice cream.   Past Medical History  Diagnosis Date  . Hyperlipidemia   . Heart murmur   . Shortness of breath     with exertion  . Hypothyroidism   . Arthritis   . GERD (gastroesophageal reflux disease)   . IBS (irritable bowel syndrome)     Past Surgical History  Procedure Laterality Date  . Cesarean section  1967  . Abdominal hysterectomy  2003  . Tubal ligation  1970's  . Colonoscopy  2011    Dr. Arnoldo Morale: sigmoid diverticulosis, repeat Aug 2016 due to history of adenomatous polyps  . Esophagogastroduodenoscopy  2011    Dr. Arnoldo Morale: multiple gastric polyps, path with mild chronic gastritis  . Cholecystectomy  01/10/2011    Procedure: LAPAROSCOPIC CHOLECYSTECTOMY;  Surgeon: Scherry Ran;  Location: [facility];  Service: General;  Laterality: N/A;  . Cataract extraction w/phaco Left 05/03/2012    Procedure: CATARACT EXTRACTION PHACO AND INTRAOCULAR LENS PLACEMENT (IOC);  Surgeon: Tonny Branch, MD;  Location: [facility];  Service: Ophthalmology;  Laterality: Left;  CDE: 16.80  . Cataract extraction w/phaco Right 05/21/2012    Procedure: CATARACT EXTRACTION PHACO AND INTRAOCULAR LENS PLACEMENT (IOC);  Surgeon: Tonny Branch, MD;  Location: [facility];  Service: Ophthalmology;  Laterality: Right;  CDE=12.31  . Colonoscopy N/A  02/04/2013    Dr. Gala Romney :Colonic diverticulosis, repeat in 2019  . Esophagogastroduodenoscopy N/A 02/04/2013    Dr. Rourk:Small hiatal hernia. Gastric polyps s/p bx, negative H.pylori. Benign path.     Current Outpatient Prescriptions  Medication Sig Dispense Refill  . acetaminophen (TYLENOL) 500 MG tablet Take 1,000 mg by mouth every 6 (six) hours as needed for moderate pain.      . hydrochlorothiazide (HYDRODIURIL) 25 MG tablet Take 25 mg by mouth daily.        Marland Kitchen levothyroxine (SYNTHROID, LEVOTHROID) 88 MCG tablet Take 88 mcg by mouth daily.        Marland Kitchen omeprazole (PRILOSEC) 20 MG capsule Take 1 capsule (20 mg total) by mouth daily.  30 capsule  3  . propranolol (INDERAL) 80 MG tablet Take 80 mg by mouth daily.      . psyllium (METAMUCIL) 58.6 % powder Take 1 packet by mouth 2 (two) times daily.      . Linaclotide (LINZESS) 145 MCG CAPS capsule Take 1 capsule (145 mcg total) by mouth daily.  30 capsule  11  . polyethylene glycol-electrolytes (TRILYTE) 420 G solution Take 4,000 mLs by mouth as directed.  4000 mL  0   No current facility-administered medications for this visit.    Allergies as of 03/12/2013 - Review Complete 03/12/2013  Allergen Reaction Noted  . Metoclopramide hcl Other (See Comments) 01/04/2011  . Aspirin  01/28/2013    Family History  Problem Relation Age of Onset  . Anesthesia problems Neg Hx   .  Hypotension Neg Hx   . Malignant hyperthermia Neg Hx   . Pseudochol deficiency Neg Hx   . Colon cancer Neg Hx     History   Social History  . Marital Status: Married    Spouse Name: N/A    Number of Children: N/A  . Years of Education: N/A   Social History Main Topics  . Smoking status: Never Smoker   . Smokeless tobacco: None  . Alcohol Use: No  . Drug Use: No  . Sexual Activity: Yes    Birth Control/ Protection: None   Other Topics Concern  . None   Social History Narrative  . None    Review of Systems: As mentioned in HPI.   Physical Exam: BP  113/65  Pulse 61  Temp(Src) 98.4 F (36.9 C) (Oral)  Wt 195 lb 6.4 oz (88.633 kg) General:   Alert and oriented. No distress noted. Pleasant and cooperative.  Head:  Normocephalic and atraumatic. Eyes:  Conjuctiva clear without scleral icterus. Abdomen:  +BS, soft, non-tender and non-distended. No rebound or guarding. No HSM or masses noted. Possible small umbilical hernia.  Msk:  Symmetrical without gross deformities. Normal posture. Extremities:  Trace ankle/pedal edema Neurologic:  Alert and  oriented x4;  grossly normal neurologically. Skin:  Intact without significant lesions or rashes. Psych:  Alert and cooperative. Normal mood and affect.  Lab Results  Component Value Date   WBC 6.9 01/02/2013   HGB 14.6 01/02/2013   HCT 42.2 01/02/2013   MCV 83.9 01/02/2013   PLT 236 01/02/2013   Lab Results  Component Value Date   TSH 0.721 01/02/2013

## 2013-03-12 NOTE — Assessment & Plan Note (Signed)
Controlled. Unknowingly was taking 2 PPIs to include Protonix and Prilosec. Stop Protonix due to cost. Continue Prilosec daily. Nausea improved with PPI. 3 month return. If persistent nausea, consider GES.

## 2013-06-10 ENCOUNTER — Ambulatory Visit (INDEPENDENT_AMBULATORY_CARE_PROVIDER_SITE_OTHER): Payer: Medicare Other | Admitting: Gastroenterology

## 2013-06-10 ENCOUNTER — Encounter: Payer: Self-pay | Admitting: Gastroenterology

## 2013-06-10 ENCOUNTER — Encounter (INDEPENDENT_AMBULATORY_CARE_PROVIDER_SITE_OTHER): Payer: Self-pay

## 2013-06-10 VITALS — BP 121/74 | HR 82 | Temp 97.5°F | Ht 66.0 in | Wt 197.6 lb

## 2013-06-10 DIAGNOSIS — K589 Irritable bowel syndrome without diarrhea: Secondary | ICD-10-CM

## 2013-06-10 DIAGNOSIS — K219 Gastro-esophageal reflux disease without esophagitis: Secondary | ICD-10-CM

## 2013-06-10 MED ORDER — LUBIPROSTONE 24 MCG PO CAPS: 24.0000 ug | ORAL_CAPSULE | Freq: Two times a day (BID) | ORAL | Status: DC

## 2013-06-10 NOTE — Progress Notes (Signed)
Referring Provider: Glenda Chroman., MD Primary Care Physician:  Glenda Chroman., MD Primary GI: Dr. Gala Romney   Chief Complaint  Patient presents with  . Follow-up    HPI:   [name] presents today with history of heme positive stool, nausea. EGD/TCS on file and normal. CBC, TSH, celiac serologies normal. Nausea improved with PPI. On Prilosec daily. Constipation: prescribed Linzess but was 235$ for a month supply. Did well with this but unable to continue taking due to cost. Applied for Medicare Part D. Will hear in 2 weeks.   Nauseated a few times last week in setting of constipation. States nausea goes away after has a BM. Taking Miralax each evening. Has to take MOM and suppositories as well. Still without good results. GERD controlled with Prilosec.    Past Medical History  Diagnosis Date  . Hyperlipidemia   . Heart murmur   . Shortness of breath     with exertion  . Hypothyroidism   . Arthritis   . GERD (gastroesophageal reflux disease)   . IBS (irritable bowel syndrome)     Past Surgical History  Procedure Laterality Date  . Cesarean section  1967  . Abdominal hysterectomy  2003  . Tubal ligation  1970's  . Colonoscopy  2011    Dr. Arnoldo Morale: sigmoid diverticulosis, repeat Aug 2016 due to history of adenomatous polyps  . Esophagogastroduodenoscopy  2011    Dr. Arnoldo Morale: multiple gastric polyps, path with mild chronic gastritis  . Cholecystectomy  01/10/2011    Procedure: LAPAROSCOPIC CHOLECYSTECTOMY;  Surgeon: Scherry Ran;  Location: [facility];  Service: General;  Laterality: N/A;  . Cataract extraction w/phaco Left 05/03/2012    Procedure: CATARACT EXTRACTION PHACO AND INTRAOCULAR LENS PLACEMENT (IOC);  Surgeon: Tonny Branch, MD;  Location: [facility];  Service: Ophthalmology;  Laterality: Left;  CDE: 16.80  . Cataract extraction w/phaco Right 05/21/2012    Procedure: CATARACT EXTRACTION PHACO AND INTRAOCULAR LENS PLACEMENT (IOC);  Surgeon: Tonny Branch, MD;  Location:  [facility];  Service: Ophthalmology;  Laterality: Right;  CDE=12.31  . Colonoscopy N/A 02/04/2013    Dr. Gala Romney :Colonic diverticulosis, repeat in 2019  . Esophagogastroduodenoscopy N/A 02/04/2013    Dr. Rourk:Small hiatal hernia. Gastric polyps s/p bx, negative H.pylori. Benign path.     Current Outpatient Prescriptions  Medication Sig Dispense Refill  . acetaminophen (TYLENOL) 500 MG tablet Take 1,000 mg by mouth every 6 (six) hours as needed for moderate pain.      . hydrochlorothiazide (HYDRODIURIL) 25 MG tablet Take 25 mg by mouth daily.        Marland Kitchen levothyroxine (SYNTHROID, LEVOTHROID) 88 MCG tablet Take 88 mcg by mouth daily.        Marland Kitchen omeprazole (PRILOSEC) 20 MG capsule Take 1 capsule (20 mg total) by mouth daily.  30 capsule  3  . propranolol (INDERAL) 80 MG tablet Take 80 mg by mouth daily.      . psyllium (METAMUCIL) 58.6 % powder Take 1 packet by mouth 2 (two) times daily.       No current facility-administered medications for this visit.    Allergies as of 06/10/2013 - Review Complete 06/10/2013  Allergen Reaction Noted  . Metoclopramide hcl Other (See Comments) 01/04/2011  . Aspirin  01/28/2013    Family History  Problem Relation Age of Onset  . Anesthesia problems Neg Hx   . Hypotension Neg Hx   . Malignant hyperthermia Neg Hx   . Pseudochol deficiency Neg Hx   .  Colon cancer Neg Hx     History   Social History  . Marital Status: Married    Spouse Name: N/A    Number of Children: N/A  . Years of Education: N/A   Social History Main Topics  . Smoking status: Never Smoker   . Smokeless tobacco: None  . Alcohol Use: No  . Drug Use: No  . Sexual Activity: Yes    Birth Control/ Protection: None   Other Topics Concern  . None   Social History Narrative  . None    Review of Systems: As mentioned in HPI.   Physical Exam: BP 121/74  Pulse 82  Temp(Src) 97.5 F (36.4 C) (Oral)  Ht 5\' 6"  (1.676 m)  Wt 197 lb 9.6 oz (89.631 kg)  BMI 31.91 kg/m2 General:    Alert and oriented. No distress noted. Pleasant and cooperative.  Head:  Normocephalic and atraumatic. Abdomen:  +BS, soft, non-tender and non-distended. No rebound or guarding. Possible small umbilical hernia.  Msk:  Symmetrical without gross deformities. Normal posture. Extremities:  Without edema. Neurologic:  Alert and  oriented x4;  grossly normal neurologically. Psych:  Alert and cooperative. Normal mood and affect.  Lab Results  Component Value Date   WBC 6.9 01/02/2013   HGB 14.6 01/02/2013   HCT 42.2 01/02/2013   MCV 83.9 01/02/2013   PLT 236 01/02/2013   Lab Results  Component Value Date   TSH 0.721 01/02/2013

## 2013-06-10 NOTE — Patient Instructions (Signed)
I have provided samples of a constipation medication called Amitiza. You will take one gelcap WITH FOOD twice a day. You may cut this back to once a day if needed.   Once you have Part D insurance, contact us and let me know if you want to continue Linzess or Amitiza. If you need help with samples while this is working out, we will do our best to provide what we can.   Return in 3 months!

## 2013-06-10 NOTE — Assessment & Plan Note (Signed)
Controlled with Prilosec daily. Nausea seems to be more related to constipation. Doubt underlying occult issue such as delayed gastric emptying. Continue Prilosec daily. Constipation management as outlined.

## 2013-06-10 NOTE — Assessment & Plan Note (Signed)
IBS-C. Nausea directly associated with significant constipation and relieved with defecation. Unable to afford Linzess due to lack of Medicare part D. Will trial Amitiza 24 mcg po BID; samples provided. Should have insurance about 2 weeks; at that time, she will call and let us know which agent she would like to continue (Linzess or Amitiza). Otherwise, return in 3 months.

## 2013-06-10 NOTE — Progress Notes (Signed)
CC'd to pcp 

## 2013-08-07 ENCOUNTER — Encounter (HOSPITAL_COMMUNITY): Payer: Self-pay | Admitting: Emergency Medicine

## 2013-08-07 ENCOUNTER — Inpatient Hospital Stay (HOSPITAL_COMMUNITY)
Admission: EM | Admit: 2013-08-07 | Discharge: 2013-08-09 | DRG: 440 | Disposition: A | Discharge status: Home / Self Care | Payer: Medicare Other | Attending: Internal Medicine | Admitting: Internal Medicine

## 2013-08-07 ENCOUNTER — Inpatient Hospital Stay (HOSPITAL_COMMUNITY): Payer: Medicare Other

## 2013-08-07 DIAGNOSIS — E876 Hypokalemia: Secondary | ICD-10-CM | POA: Diagnosis present

## 2013-08-07 DIAGNOSIS — M129 Arthropathy, unspecified: Secondary | ICD-10-CM | POA: Diagnosis present

## 2013-08-07 DIAGNOSIS — K859 Acute pancreatitis without necrosis or infection, unspecified: Principal | ICD-10-CM | POA: Diagnosis present

## 2013-08-07 DIAGNOSIS — K589 Irritable bowel syndrome without diarrhea: Secondary | ICD-10-CM | POA: Diagnosis present

## 2013-08-07 DIAGNOSIS — K219 Gastro-esophageal reflux disease without esophagitis: Secondary | ICD-10-CM | POA: Diagnosis present

## 2013-08-07 DIAGNOSIS — R945 Abnormal results of liver function studies: Secondary | ICD-10-CM

## 2013-08-07 DIAGNOSIS — R7989 Other specified abnormal findings of blood chemistry: Secondary | ICD-10-CM

## 2013-08-07 DIAGNOSIS — E039 Hypothyroidism, unspecified: Secondary | ICD-10-CM | POA: Diagnosis present

## 2013-08-07 DIAGNOSIS — R7401 Elevation of levels of liver transaminase levels: Secondary | ICD-10-CM

## 2013-08-07 DIAGNOSIS — R740 Nonspecific elevation of levels of transaminase and lactic acid dehydrogenase [LDH]: Secondary | ICD-10-CM

## 2013-08-07 DIAGNOSIS — R739 Hyperglycemia, unspecified: Secondary | ICD-10-CM | POA: Diagnosis present

## 2013-08-07 DIAGNOSIS — R109 Unspecified abdominal pain: Secondary | ICD-10-CM | POA: Diagnosis present

## 2013-08-07 DIAGNOSIS — R748 Abnormal levels of other serum enzymes: Secondary | ICD-10-CM | POA: Diagnosis present

## 2013-08-07 DIAGNOSIS — E785 Hyperlipidemia, unspecified: Secondary | ICD-10-CM | POA: Diagnosis present

## 2013-08-07 DIAGNOSIS — R7309 Other abnormal glucose: Secondary | ICD-10-CM

## 2013-08-07 LAB — CBC WITH DIFFERENTIAL/PLATELET
BASOS PCT: 0 % (ref 0–1)
Basophils Absolute: 0 10*3/uL (ref 0.0–0.1)
EOS ABS: 0 10*3/uL (ref 0.0–0.7)
EOS PCT: 0 % (ref 0–5)
HCT: 40.9 % (ref 36.0–46.0)
Hemoglobin: 14.3 g/dL (ref 12.0–15.0)
LYMPHS ABS: 0.5 10*3/uL — AB (ref 0.7–4.0)
Lymphocytes Relative: 6 % — ABNORMAL LOW (ref 12–46)
MCH: 29.6 pg (ref 26.0–34.0)
MCHC: 35 g/dL (ref 30.0–36.0)
MCV: 84.7 fL (ref 78.0–100.0)
Monocytes Absolute: 0.3 10*3/uL (ref 0.1–1.0)
Monocytes Relative: 4 % (ref 3–12)
NEUTROS PCT: 90 % — AB (ref 43–77)
Neutro Abs: 6.9 10*3/uL (ref 1.7–7.7)
PLATELETS: 181 10*3/uL (ref 150–400)
RBC: 4.83 MIL/uL (ref 3.87–5.11)
RDW: 12.3 % (ref 11.5–15.5)
WBC: 7.8 10*3/uL (ref 4.0–10.5)

## 2013-08-07 LAB — COMPREHENSIVE METABOLIC PANEL
ALBUMIN: 3.7 g/dL (ref 3.5–5.2)
ALK PHOS: 321 U/L — AB (ref 39–117)
ALT: 375 U/L — ABNORMAL HIGH (ref 0–35)
AST: 566 U/L — ABNORMAL HIGH (ref 0–37)
BUN: 15 mg/dL (ref 6–23)
CALCIUM: 9.7 mg/dL (ref 8.4–10.5)
CO2: 26 mEq/L (ref 19–32)
Chloride: 95 mEq/L — ABNORMAL LOW (ref 96–112)
Creatinine, Ser: 0.73 mg/dL (ref 0.50–1.10)
GFR calc non Af Amer: 86 mL/min — ABNORMAL LOW (ref >90)
GLUCOSE: 209 mg/dL — AB (ref 70–99)
POTASSIUM: 3.1 meq/L — AB (ref 3.7–5.3)
Sodium: 138 mEq/L (ref 137–147)
TOTAL PROTEIN: 7.1 g/dL (ref 6.0–8.3)
Total Bilirubin: 2 mg/dL — ABNORMAL HIGH (ref 0.3–1.2)

## 2013-08-07 LAB — URINALYSIS, ROUTINE W REFLEX MICROSCOPIC
Glucose, UA: NEGATIVE mg/dL
Ketones, ur: NEGATIVE mg/dL
Leukocytes, UA: NEGATIVE
NITRITE: NEGATIVE
PH: 6 (ref 5.0–8.0)
Protein, ur: NEGATIVE mg/dL
SPECIFIC GRAVITY, URINE: 1.025 (ref 1.005–1.030)
UROBILINOGEN UA: 4 mg/dL — AB (ref 0.0–1.0)

## 2013-08-07 LAB — URINE MICROSCOPIC-ADD ON

## 2013-08-07 LAB — LIPASE, BLOOD: Lipase: >3000 U/L — ABNORMAL HIGH (ref 11–59)

## 2013-08-07 MED ORDER — METOPROLOL TARTRATE 1 MG/ML IV SOLN
5.0000 mg | INTRAVENOUS | Status: DC
  Administered 2013-08-07 – 2013-08-09 (×5): 5 mg via INTRAVENOUS
  Filled 2013-08-07 (×8): qty 5

## 2013-08-07 MED ORDER — SODIUM CHLORIDE 0.9 % IV SOLN
Freq: Once | INTRAVENOUS | Status: AC
  Administered 2013-08-07: 19:00:00 via INTRAVENOUS

## 2013-08-07 MED ORDER — SODIUM CHLORIDE 0.9 % IV SOLN
INTRAVENOUS | Status: DC
  Administered 2013-08-07: 19:00:00 via INTRAVENOUS

## 2013-08-07 MED ORDER — HEPARIN SODIUM (PORCINE) 5000 UNIT/ML IJ SOLN
5000.0000 [IU] | Freq: Three times a day (TID) | INTRAMUSCULAR | Status: DC
  Administered 2013-08-07 – 2013-08-09 (×5): 5000 [IU] via SUBCUTANEOUS
  Filled 2013-08-07 (×5): qty 1

## 2013-08-07 MED ORDER — ONDANSETRON HCL 4 MG/2ML IJ SOLN: 4.0000 mg | Freq: Three times a day (TID) | INTRAMUSCULAR | Status: DC | PRN

## 2013-08-07 MED ORDER — HYDROCODONE-ACETAMINOPHEN 5-325 MG PO TABS
1.0000 | ORAL_TABLET | Freq: Once | ORAL | Status: AC
  Administered 2013-08-07: 1 via ORAL
  Filled 2013-08-07: qty 1

## 2013-08-07 MED ORDER — ALBUTEROL SULFATE (2.5 MG/3ML) 0.083% IN NEBU: 2.5000 mg | INHALATION_SOLUTION | RESPIRATORY_TRACT | Status: DC | PRN

## 2013-08-07 MED ORDER — ONDANSETRON HCL 4 MG/2ML IJ SOLN
4.0000 mg | Freq: Four times a day (QID) | INTRAMUSCULAR | Status: DC | PRN
  Administered 2013-08-07 – 2013-08-08 (×3): 4 mg via INTRAVENOUS
  Filled 2013-08-07 (×3): qty 2

## 2013-08-07 MED ORDER — ONDANSETRON HCL 4 MG PO TABS: 4.0000 mg | ORAL_TABLET | Freq: Four times a day (QID) | ORAL | Status: DC | PRN

## 2013-08-07 MED ORDER — POTASSIUM CHLORIDE 10 MEQ/100ML IV SOLN
10.0000 meq | Freq: Once | INTRAVENOUS | Status: AC
  Administered 2013-08-07: 10 meq via INTRAVENOUS
  Filled 2013-08-07: qty 100

## 2013-08-07 MED ORDER — ACETAMINOPHEN 650 MG RE SUPP: 650.0000 mg | Freq: Four times a day (QID) | RECTAL | Status: DC | PRN

## 2013-08-07 MED ORDER — HYDROMORPHONE HCL PF 1 MG/ML IJ SOLN: 0.5000 mg | INTRAMUSCULAR | Status: DC | PRN

## 2013-08-07 MED ORDER — ACETAMINOPHEN 325 MG PO TABS: 650.0000 mg | ORAL_TABLET | Freq: Four times a day (QID) | ORAL | Status: DC | PRN

## 2013-08-07 MED ORDER — ONDANSETRON HCL 4 MG/2ML IJ SOLN
4.0000 mg | Freq: Once | INTRAMUSCULAR | Status: AC
  Administered 2013-08-07: 4 mg via INTRAVENOUS

## 2013-08-07 MED ORDER — INSULIN ASPART 100 UNIT/ML ~~LOC~~ SOLN
0.0000 [IU] | Freq: Three times a day (TID) | SUBCUTANEOUS | Status: DC
  Administered 2013-08-09: 1 [IU] via SUBCUTANEOUS

## 2013-08-07 MED ORDER — IOHEXOL 300 MG/ML  SOLN
100.0000 mL | Freq: Once | INTRAMUSCULAR | Status: AC | PRN
  Administered 2013-08-07: 100 mL via INTRAVENOUS

## 2013-08-07 MED ORDER — MORPHINE SULFATE 2 MG/ML IJ SOLN
4.0000 mg | Freq: Once | INTRAMUSCULAR | Status: AC
  Administered 2013-08-07: 4 mg via INTRAVENOUS
  Filled 2013-08-07: qty 2

## 2013-08-07 MED ORDER — POTASSIUM CHLORIDE IN NACL 40-0.9 MEQ/L-% IV SOLN
INTRAVENOUS | Status: DC
  Administered 2013-08-07: 130 mL/h via INTRAVENOUS

## 2013-08-07 MED ORDER — ONDANSETRON HCL 4 MG/2ML IJ SOLN
4.0000 mg | Freq: Once | INTRAMUSCULAR | Status: DC
  Filled 2013-08-07: qty 2

## 2013-08-07 MED ORDER — PANTOPRAZOLE SODIUM 40 MG IV SOLR
40.0000 mg | Freq: Every day | INTRAVENOUS | Status: DC
  Administered 2013-08-07 – 2013-08-09 (×3): 40 mg via INTRAVENOUS
  Filled 2013-08-07 (×3): qty 40

## 2013-08-07 MED ORDER — LEVOTHYROXINE SODIUM 100 MCG IV SOLR
50.0000 ug | Freq: Every day | INTRAVENOUS | Status: DC
Start: 2013-08-08 — End: 2013-08-09
  Administered 2013-08-08 – 2013-08-09 (×2): 50 ug via INTRAVENOUS
  Filled 2013-08-07 (×5): qty 5

## 2013-08-07 MED ORDER — PROPRANOLOL HCL 20 MG PO TABS: 80.0000 mg | ORAL_TABLET | Freq: Every evening | ORAL | Status: DC

## 2013-08-07 NOTE — ED Provider Notes (Signed)
CSN: [id number]     Arrival date & time 08/07/13  1615 History   First MD Initiated Contact with Patient 08/07/13 1705     Chief Complaint  Patient presents with  . Abdominal Pain  . Back Pain     (Consider location/radiation/quality/duration/timing/severity/associated sxs/prior Treatment) Patient is a 68 y.o. female presenting with abdominal pain and back pain. The history is provided by the patient and the spouse. No language interpreter was used.  Abdominal Pain Pain location:  Epigastric Pain quality: aching, bloating and sharp   Pain radiates to:  Back Pain severity:  Moderate Associated symptoms: nausea   Associated symptoms: no diarrhea, no fever and no vomiting   Associated symptoms comment:  The patient states the pain has been significant today but that this has been on ongoing problem for many months. She has seen a gastroenterologist with her last endoscopies 2 years ago and normal per patient. She has undergone recent abdominal US as well as MRI of abdomen (06/2013) which did not show any abnormalities. She reports intermittent nausea with vomiting. No fever. No melena. Back Pain Associated symptoms: abdominal pain   Associated symptoms: no fever     Past Medical History  Diagnosis Date  . Hyperlipidemia   . Heart murmur   . Shortness of breath     with exertion  . Hypothyroidism   . Arthritis   . GERD (gastroesophageal reflux disease)   . IBS (irritable bowel syndrome)    Past Surgical History  Procedure Laterality Date  . Cesarean section  1967  . Abdominal hysterectomy  2003  . Tubal ligation  1970's  . Colonoscopy  2011    Dr. Arnoldo Morale: sigmoid diverticulosis, repeat Aug 2016 due to history of adenomatous polyps  . Esophagogastroduodenoscopy  2011    Dr. Arnoldo Morale: multiple gastric polyps, path with mild chronic gastritis  . Cholecystectomy  01/10/2011    Procedure: LAPAROSCOPIC CHOLECYSTECTOMY;  Surgeon: Scherry Ran;  Location: [facility];  Service:  General;  Laterality: N/A;  . Cataract extraction w/phaco Left 05/03/2012    Procedure: CATARACT EXTRACTION PHACO AND INTRAOCULAR LENS PLACEMENT (IOC);  Surgeon: Tonny Branch, MD;  Location: [facility];  Service: Ophthalmology;  Laterality: Left;  CDE: 16.80  . Cataract extraction w/phaco Right 05/21/2012    Procedure: CATARACT EXTRACTION PHACO AND INTRAOCULAR LENS PLACEMENT (IOC);  Surgeon: Tonny Branch, MD;  Location: [facility];  Service: Ophthalmology;  Laterality: Right;  CDE=12.31  . Colonoscopy N/A 02/04/2013    Dr. Gala Romney :Colonic diverticulosis, repeat in 2019  . Esophagogastroduodenoscopy N/A 02/04/2013    Dr. Rourk:Small hiatal hernia. Gastric polyps s/p bx, negative H.pylori. Benign path.    Family History  Problem Relation Age of Onset  . Anesthesia problems Neg Hx   . Hypotension Neg Hx   . Malignant hyperthermia Neg Hx   . Pseudochol deficiency Neg Hx   . Colon cancer Neg Hx    History  Substance Use Topics  . Smoking status: Never Smoker   . Smokeless tobacco: Not on file  . Alcohol Use: No   OB History   Grav Para Term Preterm Abortions TAB SAB Ect Mult Living                 Review of Systems  Constitutional: Negative for fever.  Respiratory: Negative.   Cardiovascular: Negative.   Gastrointestinal: Positive for nausea and abdominal pain. Negative for vomiting, diarrhea and blood in stool.  Genitourinary: Negative.   Musculoskeletal: Positive for back pain.  Skin: Negative.  Neurological: Negative.       Allergies  Metoclopramide hcl and Aspirin  Home Medications   Prior to Admission medications   Medication Sig Start Date End Date Taking? Authorizing Provider  hydrochlorothiazide (HYDRODIURIL) 25 MG tablet Take 25 mg by mouth daily.     Yes Historical Provider, MD  levothyroxine (SYNTHROID, LEVOTHROID) 88 MCG tablet Take 88 mcg by mouth daily.     Yes Historical Provider, MD  lubiprostone (AMITIZA) 24 MCG capsule Take 1 capsule (24 mcg total) by mouth 2 (two)  times daily with a meal. 06/10/13  Yes Orvil Feil, NP  pantoprazole (PROTONIX) 40 MG tablet Take 40 mg by mouth daily.   Yes Historical Provider, MD  polyethylene glycol powder (GLYCOLAX/MIRALAX) powder Take 17 g by mouth at bedtime.   Yes Historical Provider, MD  propranolol (INDERAL) 80 MG tablet Take 80 mg by mouth every evening.    Yes Historical Provider, MD  acetaminophen (TYLENOL) 500 MG tablet Take 1,000 mg by mouth every 6 (six) hours as needed for moderate pain.    Historical Provider, MD   BP 123/43  Pulse 74  Temp(Src) 97.4 F (36.3 C) (Oral)  Resp 18  Ht 5\' 7"  (1.702 m)  Wt 198 lb (89.812 kg)  BMI 31.00 kg/m2  SpO2 95% Physical Exam  Constitutional: She is oriented to person, place, and time. She appears well-developed and well-nourished.  HENT:  Head: Normocephalic.  Neck: Normal range of motion. Neck supple.  Cardiovascular: Normal rate and regular rhythm.   Pulmonary/Chest: Effort normal and breath sounds normal.  Abdominal: Soft. She exhibits distension. There is tenderness. There is no rebound and no guarding.  General tenderness. BS hypoactive.  Musculoskeletal: Normal range of motion.  Neurological: She is alert and oriented to person, place, and time.  Skin: Skin is warm and dry. No rash noted.  Psychiatric: She has a normal mood and affect.    ED Course  Procedures (including critical care time) Labs Review Labs Reviewed  CBC WITH DIFFERENTIAL - Abnormal; Notable for the following:    Neutrophils Relative % 90 (*)    Lymphocytes Relative 6 (*)    Lymphs Abs 0.5 (*)    All other components within normal limits  COMPREHENSIVE METABOLIC PANEL - Abnormal; Notable for the following:    Potassium 3.1 (*)    Chloride 95 (*)    Glucose, Bld 209 (*)    AST 566 (*)    ALT 375 (*)    Alkaline Phosphatase 321 (*)    Total Bilirubin 2.0 (*)    GFR calc non Af Amer 86 (*)    All other components within normal limits  LIPASE, BLOOD - Abnormal; Notable for the  following:    Lipase >3000 (*)    All other components within normal limits  URINALYSIS, ROUTINE W REFLEX MICROSCOPIC - Abnormal; Notable for the following:    Hgb urine dipstick TRACE (*)    Bilirubin Urine MODERATE (*)    Urobilinogen, UA 4.0 (*)    All other components within normal limits  URINE MICROSCOPIC-ADD ON - Abnormal; Notable for the following:    Squamous Epithelial / LPF MANY (*)    All other components within normal limits    Imaging Review No results found.   EKG Interpretation None      MDM   Final diagnoses:  None    1. Pancreatitis 2. Elevated liver enzymes  Norco did not offer any relief of the pain. Dr. Christy Gentles in  to evaluate the patient. Labs pending.  6:45 - labs show transaminitis and a significant abnormal lipase indicating pancreatitis. Patient put on NPO status, IV started, additional pain medications given. Will page [facility] for admission.     Dewaine Oats, PA-C 08/07/13 2316

## 2013-08-07 NOTE — ED Notes (Signed)
Pt with abd pain and back pain for weeks off and on with nausea, denies vomiting, states that she has had tests done and found cyst on liver

## 2013-08-07 NOTE — H&P (Signed)
[facility] History and Physical  [name]:[id number] DOB: 1945/08/03 DOA: 08/07/2013  Referring physician: ED PA Charlann Lange PCP: Glenda Chroman., MD   Chief Complaint: Abdominal pain  HPI: [name] is a 68 y.o. female with a history of hypothyroidism, GERD, and irritable bowel syndrome, who presents to the hospital today with a chief complaint of abdominal pain. She states that she has had intermittent epigastric abdominal pain for several months. However, over the past 2 weeks, her pain has occurred more. She describes the pain as a sharp pain. It is located in the epigastrium and radiates to her lower abdomen and to her back. At its worse, is a 10 over 10 in intensity. There is nothing that makes the pain worse or better, but she does report eating ice cream a few days ago which was associated with more pain. She has had some nausea but no vomiting. Her last bowel movement was this morning and was considered normal. However, she did wipe a little blood on the toilet paper but she did not see blood in her stool. She has had chills but no fever. She denies pain with urination. Approximately 2 weeks ago, her primary care provider ordered an ultrasound of her abdomen for evaluation. She reports that it revealed a questionable lesion in her liver. Therefore, an MRI of her abdomen was ordered and she reports that the results were unremarkable.  In the ED, she was afebrile and hemodynamically stable. Her lab data were significant for a lipase of greater than 3000, AST of 566, ALT of 375, total bilirubin of 2.0., serum potassium of 3.1 and a glucose of 209. Her urinalysis revealed 0-2 WBCs and 3-6 RBCs and many squamous cells. CT of her abdomen and pelvis revealed no acute abnormality within the abdomen or pelvis, but it did reveal basilar atelectasis, status post cholecystectomy, diverticulosis, and a duodenal diverticulum. She is being admitted for further evaluation and  management.    Review of Systems:  As above in history present illness, otherwise negative.  Past Medical History  Diagnosis Date  . Hyperlipidemia   . Heart murmur   . Shortness of breath     with exertion  . Hypothyroidism   . Arthritis   . GERD (gastroesophageal reflux disease)   . IBS (irritable bowel syndrome)    Past Surgical History  Procedure Laterality Date  . Cesarean section  1967  . Abdominal hysterectomy  2003  . Tubal ligation  1970's  . Colonoscopy  2011    Dr. Arnoldo Morale: sigmoid diverticulosis, repeat Aug 2016 due to history of adenomatous polyps  . Esophagogastroduodenoscopy  2011    Dr. Arnoldo Morale: multiple gastric polyps, path with mild chronic gastritis  . Cholecystectomy  01/10/2011    Procedure: LAPAROSCOPIC CHOLECYSTECTOMY;  Surgeon: Scherry Ran;  Location: [facility];  Service: General;  Laterality: N/A;  . Cataract extraction w/phaco Left 05/03/2012    Procedure: CATARACT EXTRACTION PHACO AND INTRAOCULAR LENS PLACEMENT (IOC);  Surgeon: Tonny Branch, MD;  Location: [facility];  Service: Ophthalmology;  Laterality: Left;  CDE: 16.80  . Cataract extraction w/phaco Right 05/21/2012    Procedure: CATARACT EXTRACTION PHACO AND INTRAOCULAR LENS PLACEMENT (IOC);  Surgeon: Tonny Branch, MD;  Location: [facility];  Service: Ophthalmology;  Laterality: Right;  CDE=12.31  . Colonoscopy N/A 02/04/2013    Dr. Gala Romney :Colonic diverticulosis, repeat in 2019  . Esophagogastroduodenoscopy N/A 02/04/2013    Dr. Rourk:Small hiatal hernia. Gastric polyps s/p bx, negative H.pylori. Benign path.  Social History: She is married. She is retired. She has 3 children. She denies tobacco, alcohol, and illicit drug use.    Allergies  Allergen Reactions  . Metoclopramide Hcl Other (See Comments)    "Chemical Imbalance" per pt  . Aspirin     Intolerance because of acid reflux    Family History  Problem Relation Age of Onset  . Anesthesia problems Neg Hx   . Hypotension Neg Hx   . Malignant  hyperthermia Neg Hx   . Pseudochol deficiency Neg Hx   . Colon cancer Neg Hx      Prior to Admission medications   Medication Sig Start Date End Date Taking? Authorizing Provider  hydrochlorothiazide (HYDRODIURIL) 25 MG tablet Take 25 mg by mouth daily.     Yes Historical Provider, MD  levothyroxine (SYNTHROID, LEVOTHROID) 88 MCG tablet Take 88 mcg by mouth daily.     Yes Historical Provider, MD  lubiprostone (AMITIZA) 24 MCG capsule Take 1 capsule (24 mcg total) by mouth 2 (two) times daily with a meal. 06/10/13  Yes Orvil Feil, NP  pantoprazole (PROTONIX) 40 MG tablet Take 40 mg by mouth daily.   Yes Historical Provider, MD  polyethylene glycol powder (GLYCOLAX/MIRALAX) powder Take 17 g by mouth at bedtime.   Yes Historical Provider, MD  propranolol (INDERAL) 80 MG tablet Take 80 mg by mouth every evening.    Yes Historical Provider, MD  acetaminophen (TYLENOL) 500 MG tablet Take 1,000 mg by mouth every 6 (six) hours as needed for moderate pain.    Historical Provider, MD   Physical Exam: Filed Vitals:   08/07/13 2124  BP: 123/67  Pulse: 78  Temp: 97.7 F (36.5 C)  Resp: 20    BP 123/67  Pulse 78  Temp(Src) 97.7 F (36.5 C) (Oral)  Resp 20  Ht 5\' 7"  (1.702 m)  Wt 89.812 kg (198 lb)  BMI 31.00 kg/m2  SpO2 95%  General:  Appears calm and comfortable; no acute distress. Eyes: PERRL, normal lids, irises & conjunctiva ENT: Oropharynx with mildly dry mucous membranes; grossly normal hearing, lips & tongue Neck: no LAD, masses or thyromegaly Cardiovascular: RRR, no m/r/g. No LE edema. Telemetry: Not applicable  Respiratory: CTA bilaterally, no w/r/r. Normal respiratory effort. Abdomen: Positive bowel sounds, obese, moderately tender in the epigastrium and right upper quadrant; no rigidity or distention or masses palpated Skin: no rash or induration seen on limited exam Musculoskeletal: grossly normal tone BUE/BLE Psychiatric: grossly normal mood and affect, speech fluent and  appropriate Neurologic: grossly non-focal.cranial nerves II through XII are intact. Strength is grossly 5 over 5 throughout and sensation is intact.           Labs on Admission:  Basic Metabolic Panel:  Recent Labs Lab 08/07/13 1748  NA 138  K 3.1*  CL 95*  CO2 26  GLUCOSE 209*  BUN 15  CREATININE 0.73  CALCIUM 9.7   Liver Function Tests:  Recent Labs Lab 08/07/13 1748  AST 566*  ALT 375*  ALKPHOS 321*  BILITOT 2.0*  PROT 7.1  ALBUMIN 3.7    Recent Labs Lab 08/07/13 1748  LIPASE >3000*   No results found for this basename: AMMONIA,  in the last 168 hours CBC:  Recent Labs Lab 08/07/13 1748  WBC 7.8  NEUTROABS 6.9  HGB 14.3  HCT 40.9  MCV 84.7  PLT 181   Cardiac Enzymes: No results found for this basename: CKTOTAL, CKMB, CKMBINDEX, TROPONINI,  in the last 168 hours  BNP (last 3 results) No results found for this basename: PROBNP,  in the last 8760 hours CBG: No results found for this basename: GLUCAP,  in the last 168 hours  Radiological Exams on Admission: Ct Abdomen Pelvis W Contrast  08/07/2013   CLINICAL DATA:  Abdominal pain and nausea.  EXAM: CT ABDOMEN AND PELVIS WITH CONTRAST  TECHNIQUE: Multidetector CT imaging of the abdomen and pelvis was performed using the standard protocol following bolus administration of intravenous contrast.  CONTRAST:  143mL OMNIPAQUE IOHEXOL 300 MG/ML  SOLN  COMPARISON:  Ultrasound 07/08/2013 and chest CT 01/04/2011  FINDINGS: Patchy densities at the lung bases may represent a combination of chronic changes and volume loss. No evidence for free air.  The gallbladder has been removed. Normal appearance of the liver and portal venous system. Extrahepatic bile duct measures up to 7 mm and normal for age and post cholecystectomy. There is a duodenum diverticula near the ampulla. Normal appearance of the spleen, adrenal glands and both kidneys. No gross abnormality to the pancreas. Calcified structures at the splenic hilum  could represent small aneurysms. The largest measures 8 mm and unchanged since 2012. No free fluid or lymphadenopathy. 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 No significant free fluid. Uterus has been removed. Fluid in the urinary bladder.  There is extensive diverticulosis in the sigmoid colon without acute inflammation. No evidence for bowel dilatation or bowel obstruction. No gross abnormality to the small bowel loops.  Degenerative facet disease in the lower lumbar spine. There is dextroscoliosis of the lumbar spine.  There are small lymph nodes near the porta hepatis. Index lesion measures 0.9 cm on sequence 2, image 23 and previously measured 0.8 cm.  IMPRESSION: No acute abnormality within the abdomen or pelvis.  Patchy densities at lung bases are nonspecific but favor atelectasis.  Post cholecystectomy.  Duodenal diverticulum.  Colonic diverticulosis without acute inflammation.  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 Largest area measures 8 mm and unchanged since 2012.   Electronically Signed   By: Markus Daft M.D.   On: 08/07/2013 21:26    EKG: Independently reviewed. Not applicable  Assessment/Plan Principal Problem:   Pancreatitis, acute Active Problems:   Abdominal pain   Elevated LFTs   Hyperglycemia   Hypokalemia   IBS (irritable bowel syndrome)   Unspecified hypothyroidism   GERD (gastroesophageal reflux disease)   1. This is a 68 year old woman with irritable bowel syndrome and GERD who presents with acute pancreatitis. The etiology is unclear as she has had a cholecystectomy in 2012 and she does not partake of alcohol. It is possible that she may have a retained stone. It is also possible that the acute pancreatitis could be secondary to hydrochlorothiazide. The elevated liver transaminases may be the consequence of inflammation from the pancreas or vice versa. She had outpatient evaluation recently with an MRI of her abdomen. Although I don't have the  results in front of me, she appears to be reliable in saying that the results were unremarkable. She is followed Dr. Gala Romney for management of irritable bowel syndrome. He will be consulted.    Plan: 1. Bowel rest.  2. IV fluid hydration with potassium chloride added for supplementation. Monitor the patient's serum potassium daily while potassium was added to the IV fluids. 3. Supportive treatment with analgesics as needed and antiemetics as needed. 4. Continue PPI, but give it IV. 5. Hold propanolol while she is n.p.o. We'll give her IV metoprolol instead. 6. Hold oral  Synthroid and given IV. 7. Consult gastroenterologist, Dr. Gala Romney tomorrow morning. We'll hold off on further imaging studies until she is evaluated by gastroenterology. 8. We'll monitor her serum potassium, lipase, and liver transaminases tomorrow morning. We'll order a magnesium level to rule out deficiency.   Code Status: Full code  Family Communication: Family not available  Disposition Plan: To be determined   Time spent: 1 hour and 10 minutes   [facility] Pager [phone]   **Disclaimer: This note may have been dictated with voice recognition software. Similar sounding words can inadvertently be transcribed and this note may contain transcription errors which may not have been corrected upon publication of note.**

## 2013-08-07 NOTE — ED Notes (Signed)
ED PA in to see patient at this time

## 2013-08-07 NOTE — ED Provider Notes (Signed)
Pt with ongoing abd pain, worse today, she is well appearing, abd soft Does have LFT abnormalities but already had cholecystitis as well as recent imaging Lipase pending Will need GI followup  Sharyon Cable, MD 08/07/13 1839

## 2013-08-07 NOTE — ED Notes (Signed)
Report given to Marion RN

## 2013-08-08 ENCOUNTER — Inpatient Hospital Stay (HOSPITAL_COMMUNITY): Payer: Medicare Other

## 2013-08-08 ENCOUNTER — Encounter (HOSPITAL_COMMUNITY): Payer: Self-pay | Admitting: Gastroenterology

## 2013-08-08 LAB — BASIC METABOLIC PANEL
BUN: 12 mg/dL (ref 6–23)
CO2: 30 meq/L (ref 19–32)
Calcium: 8.9 mg/dL (ref 8.4–10.5)
Chloride: 100 mEq/L (ref 96–112)
Creatinine, Ser: 0.7 mg/dL (ref 0.50–1.10)
GFR calc Af Amer: >90 mL/min (ref >90)
GFR calc non Af Amer: 87 mL/min — ABNORMAL LOW (ref >90)
Glucose, Bld: 96 mg/dL (ref 70–99)
POTASSIUM: 3.6 meq/L — AB (ref 3.7–5.3)
SODIUM: 141 meq/L (ref 137–147)

## 2013-08-08 LAB — HEMOGLOBIN A1C
Hgb A1c MFr Bld: 6.1 % — ABNORMAL HIGH (ref <5.7)
Mean Plasma Glucose: 128 mg/dL — ABNORMAL HIGH (ref <117)

## 2013-08-08 LAB — CBC
HCT: 38.5 % (ref 36.0–46.0)
HEMOGLOBIN: 13.3 g/dL (ref 12.0–15.0)
MCH: 29.4 pg (ref 26.0–34.0)
MCHC: 34.5 g/dL (ref 30.0–36.0)
MCV: 85 fL (ref 78.0–100.0)
Platelets: 185 10*3/uL (ref 150–400)
RBC: 4.53 MIL/uL (ref 3.87–5.11)
RDW: 12.5 % (ref 11.5–15.5)
WBC: 6.6 10*3/uL (ref 4.0–10.5)

## 2013-08-08 LAB — HEPATIC FUNCTION PANEL
ALBUMIN: 3.2 g/dL — AB (ref 3.5–5.2)
ALT: 397 U/L — ABNORMAL HIGH (ref 0–35)
AST: 391 U/L — AB (ref 0–37)
Alkaline Phosphatase: 309 U/L — ABNORMAL HIGH (ref 39–117)
BILIRUBIN DIRECT: 0.5 mg/dL — AB (ref 0.0–0.3)
BILIRUBIN TOTAL: 1.5 mg/dL — AB (ref 0.3–1.2)
Indirect Bilirubin: 1 mg/dL — ABNORMAL HIGH (ref 0.3–0.9)
Total Protein: 6.2 g/dL (ref 6.0–8.3)

## 2013-08-08 LAB — GLUCOSE, CAPILLARY
GLUCOSE-CAPILLARY: 90 mg/dL (ref 70–99)
Glucose-Capillary: 91 mg/dL (ref 70–99)
Glucose-Capillary: 91 mg/dL (ref 70–99)
Glucose-Capillary: 107 mg/dL — ABNORMAL HIGH (ref 70–99)

## 2013-08-08 LAB — TSH: TSH: 0.585 u[IU]/mL (ref 0.350–4.500)

## 2013-08-08 LAB — MAGNESIUM: Magnesium: 2.1 mg/dL (ref 1.5–2.5)

## 2013-08-08 LAB — LIPASE, BLOOD: Lipase: 1324 U/L — ABNORMAL HIGH (ref 11–59)

## 2013-08-08 MED ORDER — POTASSIUM CHLORIDE IN NACL 40-0.9 MEQ/L-% IV SOLN
INTRAVENOUS | Status: DC
  Administered 2013-08-08 (×2): 200 mL/h via INTRAVENOUS

## 2013-08-08 MED ORDER — GADOBENATE DIMEGLUMINE 529 MG/ML IV SOLN
18.0000 mL | Freq: Once | INTRAVENOUS | Status: AC | PRN
  Administered 2013-08-08: 18 mL via INTRAVENOUS

## 2013-08-08 MED ORDER — POTASSIUM CHLORIDE IN NACL 40-0.9 MEQ/L-% IV SOLN: INTRAVENOUS | Status: DC

## 2013-08-08 MED ORDER — CHLORHEXIDINE GLUCONATE 0.12 % MT SOLN
15.0000 mL | Freq: Two times a day (BID) | OROMUCOSAL | Status: DC
  Administered 2013-08-08 – 2013-08-09 (×3): 15 mL via OROMUCOSAL
  Filled 2013-08-08 (×3): qty 15

## 2013-08-08 MED ORDER — BIOTENE DRY MOUTH MT LIQD
15.0000 mL | Freq: Two times a day (BID) | OROMUCOSAL | Status: DC
  Administered 2013-08-08 – 2013-08-09 (×3): 15 mL via OROMUCOSAL

## 2013-08-08 NOTE — ED Provider Notes (Signed)
Medical screening examination/treatment/procedure(s) were conducted as a shared visit with non-physician practitioner(s) and myself.  I personally evaluated the patient during the encounter.   EKG Interpretation None        Sharyon Cable, MD 08/08/13 0002

## 2013-08-08 NOTE — Progress Notes (Signed)
UR chart review completed.  

## 2013-08-08 NOTE — Care Management Note (Addendum)
    Page 1 of 1   08/09/2013     12:20:03 PM CARE MANAGEMENT NOTE 08/09/2013  Patient:  [name], [name]   Account Number:  [id number]  Date Initiated:  08/08/2013  Documentation initiated by:  Theophilus Kinds  Subjective/Objective Assessment:   Pt admitted from home with pancreatitis. Pt lives with her husband and will return home at discharge. Pt is independent with ADL's.     Action/Plan:   No CM needs noted.   Anticipated DC Date:  08/12/2013   Anticipated DC Plan:  [place]  CM consult      Choice offered to / List presented to:             Status of service:  Completed, signed off Medicare Important Message given?  YES (If response is "NO", the following Medicare IM given date fields will be blank) Date Medicare IM given:  08/09/2013 Date Additional Medicare IM given:    Discharge Disposition:  HOME/SELF CARE  Per UR Regulation:    If discussed at Long Length of Stay Meetings, dates discussed:    Comments:  08/09/13 [place], RN BSN CM Pt to be discharged later on 08/09/13 or on 08/10/13. No CM needs noted.  08/08/13 [place], RN BSN CM

## 2013-08-08 NOTE — Progress Notes (Signed)
[facility] PROGRESS NOTE  [name] ZDG:[id number] DOB: 16-Dec-1945 DOA: 08/07/2013 PCP: Glenda Chroman., MD  Assessment/Plan: 1. Acute pancreatitis: - unclear etiology . ? Retained stone vs secondary to medications.  - recent MRI of the abdomen negative for acute pathology as per the patient.  - GI consulted and recommended MRCP For further evaluation.  - currently pt remains NPO, fluids, pain control.  -PPI.    2. Elevated liver enzymes: - improving. Probably from the pancreatitis.    3. Hypokalemia  - replete as needed.   Hypothyroidism: - resume synthroid.   dvt prophylaxis.   Code Status: full code.  Family Communication: family at bedside Disposition Plan: pending.    Consultants:  Gastroenterology.   Procedures: MRCP pending  Antibiotics: NONE HPI/Subjective: Waiting for MRCP  Objective: Filed Vitals:   08/08/13 0409  BP: 113/56  Pulse: 66  Temp: 98.2 F (36.8 C)  Resp: 20    Intake/Output Summary (Last 24 hours) at 08/08/13 1205 Last data filed at 08/08/13 0644  Gross per 24 hour  Intake 1031.33 ml  Output      0 ml  Net 1031.33 ml   Filed Weights   08/07/13 1622 08/07/13 2130 08/08/13 0409  Weight: 89.812 kg (198 lb) 89.812 kg (198 lb) 89.359 kg (197 lb)    Exam:   General:  Alert afebrile ,pleasant sitting int he chair.  Cardiovascular: s1s2  Respiratory: ctab , no wheezing or rhonchi  Abdomen: soft MILD gen tenderness.   Musculoskeletal: trace pedal edema.   Data Reviewed: Basic Metabolic Panel:  Recent Labs Lab 08/07/13 1748 08/08/13 0558  NA 138 141  K 3.1* 3.6*  CL 95* 100  CO2 26 30  GLUCOSE 209* 96  BUN 15 12  CREATININE 0.73 0.70  CALCIUM 9.7 8.9  MG  --  2.1   Liver Function Tests:  Recent Labs Lab 08/07/13 1748 08/08/13 0558  AST 566* 391*  ALT 375* 397*  ALKPHOS 321* 309*  BILITOT 2.0* 1.5*  PROT 7.1 6.2  ALBUMIN 3.7 3.2*    Recent Labs Lab 08/07/13 1748 08/08/13 0558   LIPASE >3000* 1324*   No results found for this basename: AMMONIA,  in the last 168 hours CBC:  Recent Labs Lab 08/07/13 1748 08/08/13 0558  WBC 7.8 6.6  NEUTROABS 6.9  --   HGB 14.3 13.3  HCT 40.9 38.5  MCV 84.7 85.0  PLT 181 185   Cardiac Enzymes: No results found for this basename: CKTOTAL, CKMB, CKMBINDEX, TROPONINI,  in the last 168 hours BNP (last 3 results) No results found for this basename: PROBNP,  in the last 8760 hours CBG:  Recent Labs Lab 08/08/13 0719 08/08/13 1133  GLUCAP 91 90    No results found for this or any previous visit (from the past 240 hour(s)).   Studies: Ct Abdomen Pelvis W Contrast  08/07/2013   CLINICAL DATA:  Abdominal pain and nausea.  EXAM: CT ABDOMEN AND PELVIS WITH CONTRAST  TECHNIQUE: Multidetector CT imaging of the abdomen and pelvis was performed using the standard protocol following bolus administration of intravenous contrast.  CONTRAST:  190mL OMNIPAQUE IOHEXOL 300 MG/ML  SOLN  COMPARISON:  Ultrasound 07/08/2013 and chest CT 01/04/2011  FINDINGS: Patchy densities at the lung bases may represent a combination of chronic changes and volume loss. No evidence for free air.  The gallbladder has been removed. Normal appearance of the liver and portal venous system. Extrahepatic bile duct measures up to 7 mm and normal for  age and post cholecystectomy. There is a duodenum diverticula near the ampulla. Normal appearance of the spleen, adrenal glands and both kidneys. No gross abnormality to the pancreas. Calcified structures at the splenic hilum could represent small aneurysms. The largest measures 8 mm and unchanged since 2012. No free fluid or lymphadenopathy. 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 No significant free fluid. Uterus has been removed. Fluid in the urinary bladder.  There is extensive diverticulosis in the sigmoid colon without acute inflammation. No evidence for bowel dilatation or bowel obstruction. No gross  abnormality to the small bowel loops.  Degenerative facet disease in the lower lumbar spine. There is dextroscoliosis of the lumbar spine.  There are small lymph nodes near the porta hepatis. Index lesion measures 0.9 cm on sequence 2, image 23 and previously measured 0.8 cm.  IMPRESSION: No acute abnormality within the abdomen or pelvis.  Patchy densities at lung bases are nonspecific but favor atelectasis.  Post cholecystectomy.  Duodenal diverticulum.  Colonic diverticulosis without acute inflammation.  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 Largest area measures 8 mm and unchanged since 2012.   Electronically Signed   By: Markus Daft M.D.   On: 08/07/2013 21:26    Scheduled Meds: . antiseptic oral rinse  15 mL Mouth Rinse q12n4p  . chlorhexidine  15 mL Mouth Rinse BID  . heparin  5,000 Units Subcutaneous 3 times per day  . insulin aspart  0-9 Units Subcutaneous TID WC  . levothyroxine  50 mcg Intravenous QAC breakfast  . metoprolol  5 mg Intravenous Q4H  . pantoprazole (PROTONIX) IV  40 mg Intravenous Daily   Continuous Infusions: . 0.9 % NaCl with KCl 40 mEq / L    . [START ON 08/09/2013] 0.9 % NaCl with KCl 40 mEq / L      Principal Problem:   Pancreatitis, acute Active Problems:   IBS (irritable bowel syndrome)   Unspecified hypothyroidism   GERD (gastroesophageal reflux disease)   Abdominal pain   Elevated LFTs   Hyperglycemia   Hypokalemia    Time spent: 35 minutes    [facility] Pager [phone] If 7PM-7AM, please contact night-coverage at [email or web address], password [facility] 08/08/2013, 12:05 PM  LOS: 1 day

## 2013-08-08 NOTE — Consult Note (Signed)
Referring Provider: Verlee Monte, MD Primary Care Physician:  Glenda Chroman., MD Primary Gastroenterologist:  Garfield Cornea, MD   Reason for Consultation:  Acute pancreatitis  HPI: [name] is a 68 y.o. female with history of chronic GERD, IBS who presented to the hospital with complaints of upper abdominal pain associated with nausea. We were asked to see the patient in consultation by Dr. Rexene Alberts. Patient reports intermittent epigastric pain for several months it has been worse but has been present even before her gallbladder surgery in 2012. Often symptoms brought on by a meal. This episode occurred after eating ice cream. Typically associated with nausea. All symptoms recently were related to reflux and she has switched PPIs. Currently on protonix. Typical heartburn fairly well controlled. Pain has been radiating into her back. She has a history of chronic constipation, currently taking Amitiza 24 mcg once daily. Twice daily is too much. No melena. Toilet tissue hematachezia X 1 yesterday. Appetite not too good.   Review of records: Abdominal ultrasound, 07/08/2013, she had 2.2 x 1.6 cm hypoechoic lesion in the posterior liver. MRI abdomen with and without contrast on 07/15/2013, liver lesion appeared benign. Pancreas normal on that study.  CT abdomen pelvis with contrast in the ER yesterday Suspect small aneurysms near the splenic hilum, largest measures 75mm and unchanged since 2012. Duodenal diverticulum. Atelectasis. Colonic diverticulosis.     Prior to Admission medications   Medication Sig Start Date End Date Taking? Authorizing Provider  hydrochlorothiazide (HYDRODIURIL) 25 MG tablet Take 25 mg by mouth daily.     Yes Historical Provider, MD  levothyroxine (SYNTHROID, LEVOTHROID) 88 MCG tablet Take 88 mcg by mouth daily.     Yes Historical Provider, MD  lubiprostone (AMITIZA) 24 MCG capsule Take 1 capsule (24 mcg total) by mouth 2 (two) times daily with a meal.  06/10/13  Yes Orvil Feil, NP  pantoprazole (PROTONIX) 40 MG tablet Take 40 mg by mouth daily.   Yes Historical Provider, MD  polyethylene glycol powder (GLYCOLAX/MIRALAX) powder Take 17 g by mouth at bedtime.   Yes Historical Provider, MD  propranolol (INDERAL) 80 MG tablet Take 80 mg by mouth every evening.    Yes Historical Provider, MD  acetaminophen (TYLENOL) 500 MG tablet Take 1,000 mg by mouth every 6 (six) hours as needed for moderate pain.    Historical Provider, MD    Current Facility-Administered Medications  Medication Dose Route Frequency Provider Last Rate Last Dose  . 0.9 % NaCl with KCl 40 mEq / L  infusion   Intravenous Continuous Rexene Alberts, MD 130 mL/hr at 08/07/13 2248 130 mL/hr at 08/07/13 2248  . acetaminophen (TYLENOL) tablet 650 mg  650 mg Oral Q6H PRN Rexene Alberts, MD       Or  . acetaminophen (TYLENOL) suppository 650 mg  650 mg Rectal Q6H PRN Rexene Alberts, MD      . albuterol (PROVENTIL) (2.5 MG/3ML) 0.083% nebulizer solution 2.5 mg  2.5 mg Nebulization Q2H PRN Rexene Alberts, MD      . antiseptic oral rinse (BIOTENE) solution 15 mL  15 mL Mouth Rinse q12n4p Rexene Alberts, MD      . chlorhexidine (PERIDEX) 0.12 % solution 15 mL  15 mL Mouth Rinse BID Rexene Alberts, MD      . heparin injection 5,000 Units  5,000 Units Subcutaneous 3 times per day Rexene Alberts, MD   5,000 Units at 08/08/13 [phone]  . HYDROmorphone (DILAUDID) injection 0.5-1 mg  0.5-1 mg Intravenous Q4H PRN  Rexene Alberts, MD      . insulin aspart (novoLOG) injection 0-9 Units  0-9 Units Subcutaneous TID WC Rexene Alberts, MD      . levothyroxine (SYNTHROID, LEVOTHROID) injection 50 mcg  50 mcg Intravenous QAC breakfast Rexene Alberts, MD      . metoprolol (LOPRESSOR) injection 5 mg  5 mg Intravenous Q4H Rexene Alberts, MD   5 mg at 08/08/13 0348  . ondansetron (ZOFRAN) tablet 4 mg  4 mg Oral Q6H PRN Rexene Alberts, MD       Or  . ondansetron [facility]) injection 4 mg  4 mg Intravenous Q6H PRN Rexene Alberts,  MD   4 mg at 08/07/13 2248  . pantoprazole (PROTONIX) injection 40 mg  40 mg Intravenous Daily Rexene Alberts, MD   40 mg at 08/07/13 2248    Allergies as of 08/07/2013 - Review Complete 08/07/2013  Allergen Reaction Noted  . Metoclopramide hcl Other (See Comments) 01/04/2011  . Aspirin  01/28/2013    Past Medical History  Diagnosis Date  . Hyperlipidemia   . Heart murmur   . Shortness of breath     with exertion  . Hypothyroidism   . Arthritis   . GERD (gastroesophageal reflux disease)   . IBS (irritable bowel syndrome)     Past Surgical History  Procedure Laterality Date  . Cesarean section  1967  . Abdominal hysterectomy  2003  . Tubal ligation  1970's  . Colonoscopy  2011    Dr. Arnoldo Morale: sigmoid diverticulosis, repeat Aug 2016 due to history of adenomatous polyps  . Esophagogastroduodenoscopy  2011    Dr. Arnoldo Morale: multiple gastric polyps, path with mild chronic gastritis  . Cholecystectomy  01/10/2011    Procedure: LAPAROSCOPIC CHOLECYSTECTOMY;  Surgeon: Scherry Ran;  Location: [facility];  Service: General;  Laterality: N/A;  . Cataract extraction w/phaco Left 05/03/2012    Procedure: CATARACT EXTRACTION PHACO AND INTRAOCULAR LENS PLACEMENT (IOC);  Surgeon: Tonny Branch, MD;  Location: [facility];  Service: Ophthalmology;  Laterality: Left;  CDE: 16.80  . Cataract extraction w/phaco Right 05/21/2012    Procedure: CATARACT EXTRACTION PHACO AND INTRAOCULAR LENS PLACEMENT (IOC);  Surgeon: Tonny Branch, MD;  Location: [facility];  Service: Ophthalmology;  Laterality: Right;  CDE=12.31  . Colonoscopy N/A 02/04/2013    Dr. Gala Romney :Colonic diverticulosis, repeat in 2019  . Esophagogastroduodenoscopy N/A 02/04/2013    Dr. Olesya Wike:Small hiatal hernia. Gastric polyps s/p bx, negative H.pylori. Benign path.     Family History  Problem Relation Age of Onset  . Anesthesia problems Neg Hx   . Hypotension Neg Hx   . Malignant hyperthermia Neg Hx   . Pseudochol deficiency Neg Hx   . Colon cancer  Neg Hx   . Pancreatitis Neg Hx     History   Social History  . Marital Status: Married    Spouse Name: N/A    Number of Children: N/A  . Years of Education: N/A   Occupational History  . Not on file.   Social History Main Topics  . Smoking status: Never Smoker   . Smokeless tobacco: Not on file  . Alcohol Use: No  . Drug Use: No  . Sexual Activity: Yes    Birth Control/ Protection: None   Other Topics Concern  . Not on file   Social History Narrative  . No narrative on file     ROS:  General: Negative for anorexia, weight loss, fever, chills, fatigue, weakness. Weight up 8 pounds since November 2014  Eyes: Negative for vision changes.  ENT: Negative for hoarseness, difficulty swallowing , nasal congestion. CV: Negative for chest pain, angina, palpitations, dyspnea on exertion, peripheral edema.  Respiratory: Negative for dyspnea at rest, dyspnea on exertion, cough, sputum, wheezing.  GI: See history of present illness. GU:  Negative for dysuria, hematuria, urinary incontinence, urinary frequency, nocturnal urination.  MS: Negative for joint pain, low back pain. See history of present illness Derm: Negative for rash or itching.  Neuro: Negative for weakness, abnormal sensation, seizure, frequent headaches, memory loss, confusion.  Psych: Negative for anxiety, depression, suicidal ideation, hallucinations.  Endo: Negative for unusual weight change.  Heme: Negative for bruising or bleeding. Allergy: Negative for rash or hives.       Physical Examination: Vital signs in last 24 hours: Temp:  [97.4 F (36.3 C)-98.2 F (36.8 C)] 98.2 F (36.8 C) (06/11 0409) Pulse Rate:  [66-81] 66 (06/11 0409) Resp:  [17-25] 20 (06/11 0409) BP: (113-142)/(43-71) 113/56 mmHg (06/11 0409) SpO2:  [91 %-95 %] 94 % (06/11 0409) Weight:  [197 lb (89.359 kg)-198 lb (89.812 kg)] 197 lb (89.359 kg) (06/11 0409) Last BM Date: 08/07/13  General: Well-nourished, well-developed in no acute  distress.  Head: Normocephalic, atraumatic.   Eyes: Conjunctiva pink, no icterus. Mouth: Oropharyngeal mucosa moist and pink , no lesions erythema or exudate. Neck: Supple without thyromegaly, masses, or lymphadenopathy.  Lungs: Clear to auscultation bilaterally.  Heart: Regular rate and rhythm, no murmurs rubs or gallops.  Abdomen: Bowel sounds are normal, moderate epigastric tenderness, nondistended, no hepatosplenomegaly or masses, no abdominal bruits or    hernia , no rebound or guarding.   Rectal: Not performed Extremities: Trace lower extremity edema. No clubbing, deformity.  Neuro: Alert and oriented x 4 , grossly normal neurologically.  Skin: Warm and dry, no rash or jaundice.   Psych: Alert and cooperative, normal mood and affect.        Intake/Output from previous day: 06/10 0701 - 06/11 0700 In: 1031.3 [I.V.:1031.3] Out: -  Intake/Output this shift:    Lab Results: CBC  Recent Labs  08/07/13 1748 08/08/13 0558  WBC 7.8 6.6  HGB 14.3 13.3  HCT 40.9 38.5  MCV 84.7 85.0  PLT 181 185   BMET  Recent Labs  08/07/13 1748 08/08/13 0558  NA 138 141  K 3.1* 3.6*  CL 95* 100  CO2 26 30  GLUCOSE 209* 96  BUN 15 12  CREATININE 0.73 0.70  CALCIUM 9.7 8.9   LFT  Recent Labs  08/07/13 1748 08/08/13 0558  BILITOT 2.0* 1.5*  BILIDIR  --  0.5*  IBILI  --  1.0*  ALKPHOS 321* 309*  AST 566* 391*  ALT 375* 397*  PROT 7.1 6.2  ALBUMIN 3.7 3.2*    Lipase  Recent Labs  08/07/13 1748 08/08/13 0558  LIPASE >3000* 1324*    PT/INR No results found for this basename: LABPROT, INR,  in the last 72 hours    Imaging Studies: Ct Abdomen Pelvis W Contrast  08/07/2013   CLINICAL DATA:  Abdominal pain and nausea.  EXAM: CT ABDOMEN AND PELVIS WITH CONTRAST  TECHNIQUE: Multidetector CT imaging of the abdomen and pelvis was performed using the standard protocol following bolus administration of intravenous contrast.  CONTRAST:  155mL OMNIPAQUE IOHEXOL 300 MG/ML   SOLN  COMPARISON:  Ultrasound 07/08/2013 and chest CT 01/04/2011  FINDINGS: Patchy densities at the lung bases may represent a combination of chronic changes and volume loss. No evidence for free air.  The gallbladder has been removed. Normal appearance of the liver and portal venous system. Extrahepatic bile duct measures up to 7 mm and normal for age and post cholecystectomy. There is a duodenum diverticula near the ampulla. Normal appearance of the spleen, adrenal glands and both kidneys. No gross abnormality to the pancreas. Calcified structures at the splenic hilum could represent small aneurysms. The largest measures 8 mm and unchanged since 2012. No free fluid or lymphadenopathy. 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 No significant free fluid. Uterus has been removed. Fluid in the urinary bladder.  There is extensive diverticulosis in the sigmoid colon without acute inflammation. No evidence for bowel dilatation or bowel obstruction. No gross abnormality to the small bowel loops.  Degenerative facet disease in the lower lumbar spine. There is dextroscoliosis of the lumbar spine.  There are small lymph nodes near the porta hepatis. Index lesion measures 0.9 cm on sequence 2, image 23 and previously measured 0.8 cm.  IMPRESSION: No acute abnormality within the abdomen or pelvis.  Patchy densities at lung bases are nonspecific but favor atelectasis.  Post cholecystectomy.  Duodenal diverticulum.  Colonic diverticulosis without acute inflammation.  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 Largest area measures 8 mm and unchanged since 2012.   Electronically Signed   By: Markus Daft M.D.   On: 08/07/2013 21:26  [4 week]   Impression: 68 year old lady who presents with acute onset epigastric pain associated with nausea. Similar symptoms in the past intermittently over the course of the past couple of years per patient. Less severe however. Recent workup by PCP included abdominal  ultrasound and MRI abdomen as outlined above. Current evaluation consistent with acute pancreatitis with suspicion for biliary etiology such as choledocholithiasis. Cannot rule out underlying occult pancreatic head mass however. Discussed with Dr. Gala Romney. Further imaging of the bile duct with MRCP needed. If unremarkable, she may need to have endoscopic ultrasound as next step. Dr. Gala Romney in agreement with the following plan. Discussed with patient.   Plan: 1. MRCP today. 2. Repeat LFTs and lipase in the morning. 3. Consider clear liquids later today or tomorrow based on MRCP findings and clinical progression. 4. Increase fluids to 200 cc per hour today.  We would like to thank you for the opportunity to participate in the care of [name].    LOS: 1 day   Neil Crouch  08/08/2013, 8:49 AM  Attending note:  Patient seen and examined. Agree with above assessment and recommendations.

## 2013-08-09 ENCOUNTER — Telehealth: Payer: Self-pay | Admitting: Gastroenterology

## 2013-08-09 ENCOUNTER — Other Ambulatory Visit: Payer: Self-pay | Admitting: Internal Medicine

## 2013-08-09 ENCOUNTER — Telehealth: Payer: Self-pay

## 2013-08-09 DIAGNOSIS — K859 Acute pancreatitis without necrosis or infection, unspecified: Secondary | ICD-10-CM

## 2013-08-09 DIAGNOSIS — E876 Hypokalemia: Secondary | ICD-10-CM

## 2013-08-09 LAB — GLUCOSE, CAPILLARY
Glucose-Capillary: 77 mg/dL (ref 70–99)
Glucose-Capillary: 169 mg/dL — ABNORMAL HIGH (ref 70–99)

## 2013-08-09 LAB — HEPATIC FUNCTION PANEL
ALK PHOS: 243 U/L — AB (ref 39–117)
ALT: 223 U/L — AB (ref 0–35)
AST: 120 U/L — AB (ref 0–37)
Albumin: 2.9 g/dL — ABNORMAL LOW (ref 3.5–5.2)
BILIRUBIN TOTAL: 0.8 mg/dL (ref 0.3–1.2)
Total Protein: 5.9 g/dL — ABNORMAL LOW (ref 6.0–8.3)

## 2013-08-09 LAB — BASIC METABOLIC PANEL
BUN: 10 mg/dL (ref 6–23)
CO2: 23 meq/L (ref 19–32)
CREATININE: 0.58 mg/dL (ref 0.50–1.10)
Calcium: 8.7 mg/dL (ref 8.4–10.5)
Chloride: 105 mEq/L (ref 96–112)
GFR calc Af Amer: >90 mL/min (ref >90)
GFR calc non Af Amer: >90 mL/min (ref >90)
GLUCOSE: 84 mg/dL (ref 70–99)
Potassium: 4.3 mEq/L (ref 3.7–5.3)
Sodium: 141 mEq/L (ref 137–147)

## 2013-08-09 LAB — TRIGLYCERIDES: TRIGLYCERIDES: 134 mg/dL (ref <150)

## 2013-08-09 LAB — LIPASE, BLOOD: Lipase: 67 U/L — ABNORMAL HIGH (ref 11–59)

## 2013-08-09 NOTE — Progress Notes (Signed)
Pt given clear liquid tray and pt consumed 100% without c/o pain or nausea.  Pt up to chair with the instruction to call if needed.  Call bell would not reach to the window so pt phone was placed in reach and patient was given the phone numbers to call.

## 2013-08-09 NOTE — Progress Notes (Addendum)
Subjective:  Patient feels much better today. Abdominal pain improved. Wants to eat.  Objective: Vital signs in last 24 hours: Temp:  [98.3 F (36.8 C)-98.6 F (37 C)] 98.3 F (36.8 C) (06/12 0501) Pulse Rate:  [73-105] 74 (06/12 0501) Resp:  [18-20] 20 (06/12 0501) BP: (108-138)/(46-78) 108/48 mmHg (06/12 0634) SpO2:  [93 %-97 %] 93 % (06/12 0501) Weight:  [202 lb 6.4 oz (91.808 kg)] 202 lb 6.4 oz (91.808 kg) (06/12 0501) Last BM Date: 08/07/13 General:   Alert,  Well-developed, well-nourished, pleasant and cooperative in NAD Head:  Normocephalic and atraumatic. Eyes:  Sclera clear, no icterus.  Chest: CTA bilaterally without rales, rhonchi, crackles.    Heart:  Regular rate and rhythm; no murmurs, clicks, rubs,  or gallops. Abdomen:  Soft, mild epigastric tenderness and nondistended. Normal bowel sounds, without guarding, and without rebound.   Extremities:  Without clubbing, deformity or edema. Neurologic:  Alert and  oriented x4;  grossly normal neurologically. Skin:  Intact without significant lesions or rashes. Psych:  Alert and cooperative. Normal mood and affect.  Intake/Output from previous day: 06/11 0701 - 06/12 0700 In: 4571.3 [I.V.:4571.3] Out: 1750 [Urine:1750] Intake/Output this shift:    Lab Results: CBC  Recent Labs  08/07/13 1748 08/08/13 0558  WBC 7.8 6.6  HGB 14.3 13.3  HCT 40.9 38.5  MCV 84.7 85.0  PLT 181 185   BMET  Recent Labs  08/07/13 1748 08/08/13 0558 08/09/13 0539  NA 138 141 141  K 3.1* 3.6* 4.3  CL 95* 100 105  CO2 26 30 23   GLUCOSE 209* 96 84  BUN 15 12 10   CREATININE 0.73 0.70 0.58  CALCIUM 9.7 8.9 8.7   LFTs  Recent Labs  08/07/13 1748 08/08/13 0558 08/09/13 0539  BILITOT 2.0* 1.5* 0.8  BILIDIR  --  0.5* <0.2  IBILI  --  1.0* NOT CALCULATED  ALKPHOS 321* 309* 243*  AST 566* 391* 120*  ALT 375* 397* 223*  PROT 7.1 6.2 5.9*  ALBUMIN 3.7 3.2* 2.9*    Recent Labs  08/07/13 1748 08/08/13 0558 08/09/13 0539   LIPASE >3000* 1324* 67*   PT/INR No results found for this basename: LABPROT, INR,  in the last 72 hours    Imaging Studies: Ct Abdomen Pelvis W Contrast  08/07/2013   CLINICAL DATA:  Abdominal pain and nausea.  EXAM: CT ABDOMEN AND PELVIS WITH CONTRAST  TECHNIQUE: Multidetector CT imaging of the abdomen and pelvis was performed using the standard protocol following bolus administration of intravenous contrast.  CONTRAST:  140mL OMNIPAQUE IOHEXOL 300 MG/ML  SOLN  COMPARISON:  Ultrasound 07/08/2013 and chest CT 01/04/2011  FINDINGS: Patchy densities at the lung bases may represent a combination of chronic changes and volume loss. No evidence for free air.  The gallbladder has been removed. Normal appearance of the liver and portal venous system. Extrahepatic bile duct measures up to 7 mm and normal for age and post cholecystectomy. There is a duodenum diverticula near the ampulla. Normal appearance of the spleen, adrenal glands and both kidneys. No gross abnormality to the pancreas. Calcified structures at the splenic hilum could represent small aneurysms. The largest measures 8 mm and unchanged since 2012. No free fluid or lymphadenopathy. 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 No significant free fluid. Uterus has been removed. Fluid in the urinary bladder.  There is extensive diverticulosis in the sigmoid colon without acute inflammation. No evidence for bowel dilatation or bowel obstruction. No gross abnormality to  the small bowel loops.  Degenerative facet disease in the lower lumbar spine. There is dextroscoliosis of the lumbar spine.  There are small lymph nodes near the porta hepatis. Index lesion measures 0.9 cm on sequence 2, image 23 and previously measured 0.8 cm.  IMPRESSION: No acute abnormality within the abdomen or pelvis.  Patchy densities at lung bases are nonspecific but favor atelectasis.  Post cholecystectomy.  Duodenal diverticulum.  Colonic diverticulosis without acute  inflammation.  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 Largest area measures 8 mm and unchanged since 2012.   Electronically Signed   By: Markus Daft M.D.   On: 08/07/2013 21:26  [2 weeks]   Assessment: [name] who presents with acute onset epigastric pain associated with nausea. Similar symptoms in the past intermittently over the course of the past couple of years per patient. Less severe however. Recent workup by PCP included abdominal ultrasound and MRI abdomen as outlined above. Current evaluation consistent with acute pancreatitis with suspicion for biliary etiology such as choledocholithiasis. Cannot rule out underlying occult pancreatic head mass however.  MRCP completed yesterday with no evidence of pancreatic mass or choledocholithiasis. Findings consistent with acute pancreatitis. LFTs and lipase improved.   Plan: 1. Clear liquid diet today. Patient really wants to go home today. She has not required any pain medication. May be reasonable to discharge patient on clear liquids for another day or so before advancing to low-fat diet. 2. She will need to have an endoscopic ultrasound for unexplained pancreatitis once current episode resolves. Plan for 4-6 weeks down the road. We will make arrangements.   LOS: 2 days   Neil Crouch  08/09/2013, 8:17 AM   Attending note:  Patient seen and examined. Agree with above assessment and recommendations. I suspect she can be discharged the next 24 hours. Agree with plans for EUS in about 4-6 weeks.

## 2013-08-09 NOTE — Telephone Encounter (Signed)
Patient needs EUS in 4-6 weeks with Dr. Ardis Hughs for unexplained pancreatitis, with bump in LFTs per Dr. Gala Romney.

## 2013-08-09 NOTE — Progress Notes (Addendum)
Patients BP was 112/46 and then 108/48 on the reassessment. Paged the on call MD as FYI. Will follow any orders given and continue to monitor the patient. Patient in no acute distress and asymptomatic at this time. MD called back, I also let him know that patient is on IV lopressor and that I held it.

## 2013-08-09 NOTE — Telephone Encounter (Signed)
Message copied by Barron Alvine on Fri Aug 09, 2013 10:36 AM ------      Message from: Milus Banister      Created: Fri Aug 09, 2013 10:05 AM      Regarding: RE: EUS       Ok, she needs upper EUS, radial +/- linear in 4-5 weeks, +MAC, for recent acute pancreatitis, unclear etiology.            Thanks                  ----- Message -----         From: Barron Alvine, CMA         Sent: 08/09/2013  10:00 AM           To: Milus Banister, MD      Subject: Melton Alar: EUS                                                              ----- Message -----         From: Michelene Gardener Lovelace         Sent: 08/09/2013   9:55 AM           To: Barron Alvine, CMA      Subject: EUS                                                      Patient needs EUS in 4-6 weeks with Dr. Ardis Hughs for unexplained pancreatitis, with bump in LFTs per Dr. Gala Romney.                        ------

## 2013-08-09 NOTE — Telephone Encounter (Signed)
I have sent [name] a message with Dr. Ardis Hughs

## 2013-08-09 NOTE — Progress Notes (Signed)
Pt ate 100% of lunch tray without c/o pain or nausea.  Pt also stated she had a very good BM.  Pt states she feels much better.

## 2013-08-11 NOTE — Discharge Summary (Signed)
Physician Discharge Summary  [name] RKY:[id number] DOB: 1945/04/29 DOA: 08/07/2013  PCP: Glenda Chroman., MD  Admit date: 08/07/2013 Discharge date: 08/09/2013  Time spent: 30 minutes  Recommendations for Outpatient Follow-up:  1. Follow up with GI as recommended 2. Follow up with PCP in one week.  Discharge Diagnoses:  Principal Problem:   Pancreatitis, acute Active Problems:   IBS (irritable bowel syndrome)   Unspecified hypothyroidism   GERD (gastroesophageal reflux disease)   Abdominal pain   Elevated LFTs   Hyperglycemia   Hypokalemia   Discharge Condition: improved.   Diet recommendation: clear liquid diet for another day or two , followed  By soft and regular.  Filed Weights   08/07/13 2130 08/08/13 0409 08/09/13 0501  Weight: 89.812 kg (198 lb) 89.359 kg (197 lb) 91.808 kg (202 lb 6.4 oz)    History of present illness:  [name] is a 68 y.o. female with a history of hypothyroidism, GERD, and irritable bowel syndrome, who presents to the hospital today with a chief complaint of abdominal pain. She states that she has had intermittent epigastric abdominal pain for several months. However, over the past 2 weeks, her pain has occurred more. She describes the pain as a sharp pain. It is located in the epigastrium and radiates to her lower abdomen and to her back. At its worse, is a 10 over 10 in intensity. There is nothing that makes the pain worse or better, but she does report eating ice cream a few days ago which was associated with more pain. She has had some nausea but no vomiting. Her last bowel movement was this morning and was considered normal. However, she did wipe a little blood on the toilet paper but she did not see blood in her stool. She has had chills but no fever. She denies pain with urination. Approximately 2 weeks ago, her primary care provider ordered an ultrasound of her abdomen for evaluation. She reports that it revealed a questionable  lesion in her liver. Therefore, an MRI of her abdomen was ordered and she reports that the results were unremarkable.  In the ED, she was afebrile and hemodynamically stable. Her lab data were significant for a lipase of greater than 3000, AST of 566, ALT of 375, total bilirubin of 2.0., serum potassium of 3.1 and a glucose of 209. Her urinalysis revealed 0-2 WBCs and 3-6 RBCs and many squamous cells. CT of her abdomen and pelvis revealed no acute abnormality within the abdomen or pelvis, but it did reveal basilar atelectasis, status post cholecystectomy, diverticulosis, and a duodenal diverticulum. She is being admitted for further evaluation and management.      Hospital Course:  Acute pancreatitis: - admitted to med surg, started on IV fluds, pain control and MRCP done. It did nto show any evidence of  of pancreatic mass or choledocholithiasis. Findings consistent with acute pancreatitis. LFTs and lipase improved. She was discharged home with recommendations to continue clear liquid diet for another day and then advance diet to soft and regular. Outpatient follow up with GI in 2 to4 weeks for outpatient endoscopic [place].    Elevated liver enzymes:  - improving. Probably from the pancreatitis.  3. Hypokalemia  - replete as needed.  Hypothyroidism:  - resume synthroid.     Procedures:  MRCP on 6/11  Consultations:  gastroenterology  Discharge Exam: Filed Vitals:   08/09/13 1336  BP: 139/59  Pulse: 83  Temp: 98.3 F (36.8 C)  Resp: 20  General: alert afebrile comfortable Cardiovascular: s1s2 Respiratory: ctab  Discharge Instructions You were cared for by a hospitalist during your hospital stay. If you have any questions about your discharge medications or the care you received while you were in the hospital after you are discharged, you can call the unit and asked to speak with the hospitalist on call if the hospitalist that took care of you is not available. Once you are  discharged, your primary care physician will handle any further medical issues. Please note that NO REFILLS for any discharge medications will be authorized once you are discharged, as it is imperative that you return to your primary care physician (or establish a relationship with a primary care physician if you do not have one) for your aftercare needs so that they can reassess your need for medications and monitor your lab values.  Discharge Instructions   Discharge instructions    Complete by:  As directed   Please take clear liquid diet for 2 more days and slowly advance diet to full liquid diet to one more day and soft diet for one day and to regular diet.  Follow up with gastroenterology as recommended.            Medication List    STOP taking these medications       acetaminophen 500 MG tablet  Commonly known as:  TYLENOL     hydrochlorothiazide 25 MG tablet  Commonly known as:  HYDRODIURIL     lubiprostone 24 MCG capsule  Commonly known as:  AMITIZA      TAKE these medications       levothyroxine 88 MCG tablet  Commonly known as:  SYNTHROID, LEVOTHROID  Take 88 mcg by mouth daily.     pantoprazole 40 MG tablet  Commonly known as:  PROTONIX  Take 40 mg by mouth daily.     polyethylene glycol powder powder  Commonly known as:  GLYCOLAX/MIRALAX  Take 17 g by mouth at bedtime.     propranolol 80 MG tablet  Commonly known as:  INDERAL  Take 80 mg by mouth every evening.       Allergies  Allergen Reactions  . Metoclopramide Hcl Other (See Comments)    "Chemical Imbalance" per pt  . Aspirin     Intolerance because of acid reflux      The results of significant diagnostics from this hospitalization (including imaging, microbiology, ancillary and laboratory) are listed below for reference.    Significant Diagnostic Studies: Ct Abdomen Pelvis W Contrast  08/07/2013   CLINICAL DATA:  Abdominal pain and nausea.  EXAM: CT ABDOMEN AND PELVIS WITH CONTRAST   TECHNIQUE: Multidetector CT imaging of the abdomen and pelvis was performed using the standard protocol following bolus administration of intravenous contrast.  CONTRAST:  140mL OMNIPAQUE IOHEXOL 300 MG/ML  SOLN  COMPARISON:  Ultrasound 07/08/2013 and chest CT 01/04/2011  FINDINGS: Patchy densities at the lung bases may represent a combination of chronic changes and volume loss. No evidence for free air.  The gallbladder has been removed. Normal appearance of the liver and portal venous system. Extrahepatic bile duct measures up to 7 mm and normal for age and post cholecystectomy. There is a duodenum diverticula near the ampulla. Normal appearance of the spleen, adrenal glands and both kidneys. No gross abnormality to the pancreas. Calcified structures at the splenic hilum could represent small aneurysms. The largest measures 8 mm and unchanged since 2012. No free fluid or lymphadenopathy. 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 No significant free fluid. Uterus has been removed. Fluid in the urinary bladder.  There is extensive diverticulosis in the sigmoid colon without acute inflammation. No evidence for bowel dilatation or bowel obstruction. No gross abnormality to the small bowel loops.  Degenerative facet disease in the lower lumbar spine. There is dextroscoliosis of the lumbar spine.  There are small lymph nodes near the porta hepatis. Index lesion measures 0.9 cm on sequence 2, image 23 and previously measured 0.8 cm.  IMPRESSION: No acute abnormality within the abdomen or pelvis.  Patchy densities at lung bases are nonspecific but favor atelectasis.  Post cholecystectomy.  Duodenal diverticulum.  Colonic diverticulosis without acute inflammation.  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 Largest area measures 8 mm and unchanged since 2012.   Electronically Signed   By: Markus Daft M.D.   On: 08/07/2013 21:26   Mr 3d Recon At Scanner  08/09/2013   CLINICAL DATA:  Pancreatitis.   Elevated liver function test.  EXAM: MRI ABDOMEN WITHOUT AND WITH CONTRAST (INCLUDING MRCP)  TECHNIQUE: Multiplanar multisequence MR imaging of the abdomen was performed both before and after the administration of intravenous contrast. Heavily T2-weighted images of the biliary and pancreatic ducts were obtained, and three-dimensional MRCP images were rendered by post processing.  CONTRAST:  14mL MULTIHANCE GADOBENATE DIMEGLUMINE 529 MG/ML IV SOLN  COMPARISON:  CT on 08/07/2013 and MRI on 07/15/2013  FINDINGS: Mild diffuse pancreatic swelling and peripancreatic inflammatory changes are best demonstrated on series 6, consistent with acute pancreatitis. No evidence of pancreatic necrosis or peripancreatic fluid collections. No pancreatic mass identified. No evidence of splenic or portal vein thrombosis.  MRCP is partially degraded by respiratory motion artifact. Prior cholecystectomy noted. There is no evidence of biliary ductal dilatation with common bile duct measuring 6 mm in diameter. No evidence of choledocholithiasis. No definite evidence of pancreas divisum.  Small approximately 1 cm cyst in the right hepatic lobe is stable. No liver masses are identified. The spleen, adrenal glands, and kidneys are normal in appearance. No evidence of abdominal lymphadenopathy or other soft tissue masses.  Several duodenal diverticulum are incidentally noted. No evidence of inflammatory process, abnormal fluid collections, or dilated abdominal bowel loops.  IMPRESSION: Mild acute pancreatitis. No evidence of pancreatic necrosis, peripancreatic fluid collections, or other complication.  Prior cholecystectomy. No evidence of biliary dilatation or choledocholithiasis.   Electronically Signed   By: Earle Gell M.D.   On: 08/09/2013 08:49   Mr Abd W/wo Cm/mrcp  08/09/2013   CLINICAL DATA:  Pancreatitis.  Elevated liver function test.  EXAM: MRI ABDOMEN WITHOUT AND WITH CONTRAST (INCLUDING MRCP)  TECHNIQUE: Multiplanar  multisequence MR imaging of the abdomen was performed both before and after the administration of intravenous contrast. Heavily T2-weighted images of the biliary and pancreatic ducts were obtained, and three-dimensional MRCP images were rendered by post processing.  CONTRAST:  78mL MULTIHANCE GADOBENATE DIMEGLUMINE 529 MG/ML IV SOLN  COMPARISON:  CT on 08/07/2013 and MRI on 07/15/2013  FINDINGS: Mild diffuse pancreatic swelling and peripancreatic inflammatory changes are best demonstrated on series 6, consistent with acute pancreatitis. No evidence of pancreatic necrosis or peripancreatic fluid collections. No pancreatic mass identified. No evidence of splenic or portal vein thrombosis.  MRCP is partially degraded by respiratory motion artifact. Prior cholecystectomy noted. There is no evidence of biliary ductal dilatation with common bile duct measuring 6 mm in diameter. No evidence of choledocholithiasis. No definite evidence of pancreas divisum.  Small approximately 1  cm cyst in the right hepatic lobe is stable. No liver masses are identified. The spleen, adrenal glands, and kidneys are normal in appearance. No evidence of abdominal lymphadenopathy or other soft tissue masses.  Several duodenal diverticulum are incidentally noted. No evidence of inflammatory process, abnormal fluid collections, or dilated abdominal bowel loops.  IMPRESSION: Mild acute pancreatitis. No evidence of pancreatic necrosis, peripancreatic fluid collections, or other complication.  Prior cholecystectomy. No evidence of biliary dilatation or choledocholithiasis.   Electronically Signed   By: Earle Gell M.D.   On: 08/09/2013 08:49    Microbiology: No results found for this or any previous visit (from the past 240 hour(s)).   Labs: Basic Metabolic Panel:  Recent Labs Lab 08/07/13 1748 08/08/13 0558 08/09/13 0539  NA 138 141 141  K 3.1* 3.6* 4.3  CL 95* 100 105  CO2 26 30 23   GLUCOSE 209* 96 84  BUN 15 12 10   CREATININE  0.73 0.70 0.58  CALCIUM 9.7 8.9 8.7  MG  --  2.1  --    Liver Function Tests:  Recent Labs Lab 08/07/13 1748 08/08/13 0558 08/09/13 0539  AST 566* 391* 120*  ALT 375* 397* 223*  ALKPHOS 321* 309* 243*  BILITOT 2.0* 1.5* 0.8  PROT 7.1 6.2 5.9*  ALBUMIN 3.7 3.2* 2.9*    Recent Labs Lab 08/07/13 1748 08/08/13 0558 08/09/13 0539  LIPASE >3000* 1324* 67*   No results found for this basename: AMMONIA,  in the last 168 hours CBC:  Recent Labs Lab 08/07/13 1748 08/08/13 0558  WBC 7.8 6.6  NEUTROABS 6.9  --   HGB 14.3 13.3  HCT 40.9 38.5  MCV 84.7 85.0  PLT 181 185   Cardiac Enzymes: No results found for this basename: CKTOTAL, CKMB, CKMBINDEX, TROPONINI,  in the last 168 hours BNP: BNP (last 3 results) No results found for this basename: PROBNP,  in the last 8760 hours CBG:  Recent Labs Lab 08/08/13 1133 08/08/13 1627 08/08/13 2144 08/09/13 0733 08/09/13 1125  GLUCAP 90 91 107* 77 169*       Signed:  Jaella Weinert  [facility] 08/09/2013, 8:43 PM

## 2013-08-12 ENCOUNTER — Telehealth: Payer: Self-pay

## 2013-08-12 NOTE — Telephone Encounter (Signed)
Called and told pt. She said she hasn't taken the Amitiza since yesterday. She has had diarrhea today ( 3 very watery stools). Sat she had about 7 watery stools. I told her not to take the Amitiza today.  Will forward to [place] for further instructions.

## 2013-08-12 NOTE — Telephone Encounter (Signed)
I have no problems with her taking Amitiza. I don't think HCTZ is a problem, it can be implicated in cases of acute pancreatitis but she should probably address with the prescribing doctor.

## 2013-08-12 NOTE — Telephone Encounter (Signed)
Pt came by the office and has questions about her discharge meds. She said the HCTZ 25 mg nor the Amitiza 24 mcg are on the discharge list.  She has been taking the Amitiza 24 mcg just once a day. It was prescribed for bid but that was too much.  She said she was told in the hospital that the HCTZ could make pancreatitis worse, but she was never told not to take it.  Please advise!

## 2013-08-13 NOTE — Telephone Encounter (Signed)
LMOM to call.

## 2013-08-13 NOTE — Telephone Encounter (Signed)
Hold Amitiza as long as she is having diarrhea.  Stools may be loose secondary to the pancreatitis and liquid diet.   If still having diarrhea in a couple of days, we can consider doing a C.diff pcr.

## 2013-08-14 NOTE — Telephone Encounter (Signed)
Pt called and was informed. She only had one episode yesterday. She will call in next day or so if she has more problems.

## 2013-08-15 ENCOUNTER — Other Ambulatory Visit: Payer: Self-pay

## 2013-08-15 DIAGNOSIS — K859 Acute pancreatitis without necrosis or infection, unspecified: Secondary | ICD-10-CM

## 2013-08-15 NOTE — Telephone Encounter (Signed)
Left message on machine to call back EUS scheduled for 08/29/13 730 am

## 2013-08-15 NOTE — Telephone Encounter (Signed)
EUS scheduled, pt instructed and medications reviewed.  Patient instructions mailed to home.  Patient to call with any questions or concerns.  

## 2013-08-16 ENCOUNTER — Encounter (HOSPITAL_COMMUNITY): Payer: Self-pay | Admitting: Pharmacy Technician

## 2013-08-16 ENCOUNTER — Encounter (HOSPITAL_COMMUNITY): Payer: Self-pay | Admitting: *Deleted

## 2013-08-28 ENCOUNTER — Telehealth: Payer: Self-pay | Admitting: *Deleted

## 2013-08-28 MED ORDER — LUBIPROSTONE 24 MCG PO CAPS: 24.0000 ug | ORAL_CAPSULE | Freq: Every day | ORAL | Status: DC

## 2013-08-28 NOTE — Telephone Encounter (Signed)
Routing to refill box  

## 2013-08-28 NOTE — Telephone Encounter (Signed)
Done

## 2013-08-28 NOTE — Telephone Encounter (Signed)
Pt called stating she needs amitiza bottle refilled as 1 pill a day pt says her old Rx has 2 pills, and she needs to be changed to 1 pill. Please advise [phone]

## 2013-08-29 ENCOUNTER — Encounter (HOSPITAL_COMMUNITY): Payer: Medicare Other | Admitting: Anesthesiology

## 2013-08-29 ENCOUNTER — Ambulatory Visit (HOSPITAL_COMMUNITY): Payer: Medicare Other | Admitting: Anesthesiology

## 2013-08-29 ENCOUNTER — Ambulatory Visit (HOSPITAL_COMMUNITY)
Admission: RE | Admit: 2013-08-29 | Discharge: 2013-08-29 | Disposition: A | Discharge status: Home / Self Care | Payer: Medicare Other | Source: Ambulatory Visit | Attending: Gastroenterology | Admitting: Gastroenterology

## 2013-08-29 ENCOUNTER — Encounter (HOSPITAL_COMMUNITY)
Admission: RE | Disposition: A | Discharge status: Home / Self Care | Payer: Self-pay | Source: Ambulatory Visit | Attending: Gastroenterology

## 2013-08-29 ENCOUNTER — Encounter (HOSPITAL_COMMUNITY): Payer: Self-pay | Admitting: *Deleted

## 2013-08-29 DIAGNOSIS — E039 Hypothyroidism, unspecified: Secondary | ICD-10-CM | POA: Insufficient documentation

## 2013-08-29 DIAGNOSIS — Z0389 Encounter for observation for other suspected diseases and conditions ruled out: Secondary | ICD-10-CM | POA: Insufficient documentation

## 2013-08-29 DIAGNOSIS — K859 Acute pancreatitis without necrosis or infection, unspecified: Secondary | ICD-10-CM

## 2013-08-29 DIAGNOSIS — I10 Essential (primary) hypertension: Secondary | ICD-10-CM | POA: Insufficient documentation

## 2013-08-29 DIAGNOSIS — Z79899 Other long term (current) drug therapy: Secondary | ICD-10-CM | POA: Insufficient documentation

## 2013-08-29 DIAGNOSIS — K219 Gastro-esophageal reflux disease without esophagitis: Secondary | ICD-10-CM | POA: Insufficient documentation

## 2013-08-29 HISTORY — PX: EUS: SHX5427

## 2013-08-29 SURGERY — UPPER ENDOSCOPIC ULTRASOUND (EUS) LINEAR
Anesthesia: Monitor Anesthesia Care

## 2013-08-29 MED ORDER — LIDOCAINE HCL (CARDIAC) 20 MG/ML IV SOLN
INTRAVENOUS | Status: AC
  Filled 2013-08-29: qty 5

## 2013-08-29 MED ORDER — GLYCOPYRROLATE 0.2 MG/ML IJ SOLN
INTRAMUSCULAR | Status: AC
  Filled 2013-08-29: qty 1

## 2013-08-29 MED ORDER — MEPERIDINE HCL 100 MG/ML IJ SOLN: 6.2500 mg | INTRAMUSCULAR | Status: DC | PRN

## 2013-08-29 MED ORDER — GLYCOPYRROLATE 0.2 MG/ML IJ SOLN
INTRAMUSCULAR | Status: DC | PRN
  Administered 2013-08-29: 0.2 mg via INTRAVENOUS

## 2013-08-29 MED ORDER — PROPOFOL 10 MG/ML IV BOLUS
INTRAVENOUS | Status: AC
  Filled 2013-08-29: qty 20

## 2013-08-29 MED ORDER — PROPOFOL 10 MG/ML IV BOLUS
INTRAVENOUS | Status: DC | PRN
  Administered 2013-08-29: 20 mg via INTRAVENOUS

## 2013-08-29 MED ORDER — LIDOCAINE HCL (CARDIAC) 20 MG/ML IV SOLN
INTRAVENOUS | Status: DC | PRN
  Administered 2013-08-29: 50 mg via INTRAVENOUS

## 2013-08-29 MED ORDER — SODIUM CHLORIDE 0.9 % IV SOLN: INTRAVENOUS | Status: DC

## 2013-08-29 MED ORDER — PROPOFOL INFUSION 10 MG/ML OPTIME
INTRAVENOUS | Status: DC | PRN
Start: 2013-08-29 — End: 2013-08-29
  Administered 2013-08-29: 300 ug/kg/min via INTRAVENOUS

## 2013-08-29 MED ORDER — PROMETHAZINE HCL 25 MG/ML IJ SOLN: 6.2500 mg | INTRAMUSCULAR | Status: DC | PRN

## 2013-08-29 MED ORDER — PROPOFOL 10 MG/ML IV BOLUS: INTRAVENOUS | Status: DC | PRN

## 2013-08-29 MED ORDER — ESMOLOL HCL 10 MG/ML IV SOLN
INTRAVENOUS | Status: DC | PRN
  Administered 2013-08-29: 10 mg via INTRAVENOUS

## 2013-08-29 MED ORDER — LACTATED RINGERS IV SOLN
INTRAVENOUS | Status: DC | PRN
  Administered 2013-08-29: 07:00:00 via INTRAVENOUS

## 2013-08-29 NOTE — Transfer of Care (Addendum)
Immediate Anesthesia Transfer of Care Note  Patient: [name]  Procedure(s) Performed: Procedure(s): UPPER ENDOSCOPIC ULTRASOUND (EUS) LINEAR (N/A)  Patient Location: ENDO Recovery  Anesthesia Type:MAC  Level of Consciousness: Patient easily awoken, sedated, comfortable, cooperative, following commands, responds to stimulation.   Airway & Oxygen Therapy: Patient spontaneously breathing, ventilating well, oxygen via [place].  Post-op Assessment: Report given to PACU RN, vital signs reviewed and stable, moving all extremities.   Post vital signs: Reviewed and stable.  Complications: No apparent anesthesia complications

## 2013-08-29 NOTE — Op Note (Addendum)
[facility] [place], [id number]   ENDOSCOPIC ULTRASOUND PROCEDURE REPORT  PATIENT: [name], [name]  MR#: [id number] BIRTHDATE: 1945/04/12  GENDER: Female ENDOSCOPIST: Milus Banister, MD REFERRED BY:  Garfield Cornea, M.D. PROCEDURE DATE:  08/29/2013 PROCEDURE:   Upper EUS ASA CLASS:      Class II INDICATIONS:   recent acute pancreatitis (lipase >3K, elevated liver tests), non-drinker, remote lap chole; recent CT/MRI shows no pancreatic masses, no CBD stones, CBD 56mm; no h/o pancreatic disease, no FH of pancreatic disease, no weight loss. MEDICATIONS: MAC sedation, administered by CRNA  DESCRIPTION OF PROCEDURE:   After the risks benefits and alternatives of the procedure were  explained, informed consent was obtained. The patient was then placed in the left, lateral, decubitus postion and IV sedation was administered. Throughout the procedure, the patients blood pressure, pulse and oxygen saturations were monitored continuously.  Under direct visualization, the EUS scope 0384  endoscope was introduced through the mouth  and advanced to the second portion of the duodenum . Water was used as necessary to provide an acoustic interface.  Upon completion of the imaging, water was removed and the patient was sent to the recovery room in satisfactory condition.   Endoscopic findings (limited views with radial echoendoscope): 1. Normal UGI tract   EUS findings: 1. Pancreatic parenchyma in uncinate, head, neck, body and tail was all normal.  No discrete masses, no signs of chronic pancreatitis. 2. Main pancreatic duct was normal; non-dilated. 3. No peripancreatic adenopathy. 4. CBD was slightly dilated (6.56mm) but was othewise normal; no stones. 5. Limited views of liver, spleen, portal and splenic vessels were all normal.   Impression: No clear anatomic cause of her recent acute pancreatitis.  I will have IgG4 level checked today (workup for  autoimmune pancreatitis). For now, I've advised her not to restart hydrochlorathiazide as it has pancreatitis listed a potential adverse effect.  Please call your primary care physician today to alert them of this change.   _______________________________ eSigned:  Milus Banister, MD 08/29/2013 8:10 AM Revised: 08/29/2013 8:10 AM

## 2013-08-29 NOTE — Discharge Instructions (Signed)

## 2013-08-29 NOTE — Interval H&P Note (Signed)
History and Physical Interval Note:  08/29/2013 7:30 AM  [name]  has presented today for surgery, with the diagnosis of Acute pancreatitis [577.0]  The various methods of treatment have been discussed with the patient and family. After consideration of risks, benefits and other options for treatment, the patient has consented to  Procedure(s): UPPER ENDOSCOPIC ULTRASOUND (EUS) LINEAR (N/A) as a surgical intervention .  The patient's history has been reviewed, patient examined, no change in status, stable for surgery.  I have reviewed the patient's chart and labs.  Questions were answered to the patient's satisfaction.     Milus Banister

## 2013-08-29 NOTE — Anesthesia Preprocedure Evaluation (Addendum)
Anesthesia Evaluation Anesthesia Physical Anesthesia Plan  ASA: III  Anesthesia Plan: MAC   Post-op Pain Management:    Induction:   Airway Management Planned: Natural Airway  Additional Equipment:   Intra-op Plan:   Post-operative Plan:   Informed Consent: I have reviewed the patients History and Physical, chart, labs and discussed the procedure including the risks, benefits and alternatives for the proposed anesthesia with the patient or authorized representative who has indicated his/her understanding and acceptance.   Dental advisory given  Plan Discussed with:   Anesthesia Plan Comments:        Anesthesia Quick Evaluation                                   Anesthesia Evaluation  Patient identified by MRN, date of birth, ID band Patient awake    Reviewed: Allergy & Precautions, H&P , NPO status , Patient's Chart, lab work & pertinent test results  History of Anesthesia Complications (+) PONV  Airway Mallampati: I TM Distance: >3 FB Neck ROM: Full    Dental  (+) Teeth Intact   Pulmonary shortness of breath and with exertion,    Pulmonary exam normal       Cardiovascular hypertension, Pt. on medications + Valvular Problems/Murmurs Rhythm:Regular Rate:Normal     Neuro/Psych    GI/Hepatic GERD-  Medicated and Controlled,  Endo/Other  Hypothyroidism   Renal/GU      Musculoskeletal   Abdominal   Peds  Hematology   Anesthesia Other Findings   Reproductive/Obstetrics                           Anesthesia Physical Anesthesia Plan  ASA: II  Anesthesia Plan: MAC   Post-op Pain Management:    Induction:   Airway Management Planned: Nasal Cannula  Additional Equipment:   Intra-op Plan:   Post-operative Plan:   Informed Consent: I have reviewed the patients History and Physical, chart, labs and discussed the procedure including the risks, benefits and alternatives for the proposed anesthesia with  the patient or authorized representative who has indicated his/her understanding and acceptance.     Plan Discussed with: CRNA  Anesthesia Plan Comments:         Anesthesia Quick Evaluation                                   Anesthesia Evaluation  Patient identified by MRN, date of birth, ID band Patient awake    Reviewed: Allergy & Precautions, H&P , NPO status , Patient's Chart, lab work & pertinent test results  History of Anesthesia Complications (+) PONV  Airway Mallampati: I TM Distance: >3 FB Neck ROM: Full    Dental  (+) Teeth Intact   Pulmonary shortness of breath and with exertion,    Pulmonary exam normal       Cardiovascular hypertension, Pt. on medications + Valvular Problems/Murmurs Rhythm:Regular Rate:Normal     Neuro/Psych    GI/Hepatic GERD-  Medicated and Controlled,  Endo/Other  Hypothyroidism   Renal/GU      Musculoskeletal   Abdominal   Peds  Hematology   Anesthesia Other Findings   Reproductive/Obstetrics                           Anesthesia Physical Anesthesia Plan  ASA: II  Anesthesia Plan: MAC   Post-op Pain Management:    Induction:   Airway Management Planned: Nasal Cannula  Additional Equipment:   Intra-op Plan:   Post-operative Plan:   Informed Consent: I have reviewed the patients History and Physical, chart, labs and discussed the procedure including the risks, benefits and alternatives for the proposed anesthesia with the patient or authorized representative who has indicated his/her understanding and acceptance.     Plan Discussed with: CRNA  Anesthesia Plan Comments:         Anesthesia Quick Evaluation                                   Anesthesia Evaluation  Patient identified by MRN, date of birth, ID band Patient awake    Reviewed: Allergy & Precautions, H&P , NPO status , Patient's Chart, lab work & pertinent test results  History of Anesthesia  Complications (+) PONV  Airway Mallampati: I TM Distance: >3 FB Neck ROM: Full    Dental  (+) Teeth Intact   Pulmonary shortness of breath and with exertion,    Pulmonary exam normal       Cardiovascular hypertension, Pt. on medications + Valvular Problems/Murmurs Rhythm:Regular Rate:Normal     Neuro/Psych    GI/Hepatic GERD-  Medicated and Controlled,  Endo/Other  Hypothyroidism   Renal/GU      Musculoskeletal   Abdominal   Peds  Hematology   Anesthesia Other Findings   Reproductive/Obstetrics                           Anesthesia Physical Anesthesia Plan  ASA: II  Anesthesia Plan: MAC   Post-op Pain Management:    Induction:   Airway Management Planned: Nasal Cannula  Additional Equipment:   Intra-op Plan:   Post-operative Plan:   Informed Consent: I have reviewed the patients History and Physical, chart, labs and discussed the procedure including the risks, benefits and alternatives for the proposed anesthesia with the patient or authorized representative who has indicated his/her understanding and acceptance.     Plan Discussed with: CRNA  Anesthesia Plan Comments:         Anesthesia Quick Evaluation

## 2013-08-29 NOTE — H&P (View-Only) (Signed)
Subjective:  Patient feels much better today. Abdominal pain improved. Wants to eat.  Objective: Vital signs in last 24 hours: Temp:  [98.3 F (36.8 C)-98.6 F (37 C)] 98.3 F (36.8 C) (06/12 0501) Pulse Rate:  [73-105] 74 (06/12 0501) Resp:  [18-20] 20 (06/12 0501) BP: (108-138)/(46-78) 108/48 mmHg (06/12 0634) SpO2:  [93 %-97 %] 93 % (06/12 0501) Weight:  [202 lb 6.4 oz (91.808 kg)] 202 lb 6.4 oz (91.808 kg) (06/12 0501) Last BM Date: 08/07/13 General:   Alert,  Well-developed, well-nourished, pleasant and cooperative in NAD Head:  Normocephalic and atraumatic. Eyes:  Sclera clear, no icterus.  Chest: CTA bilaterally without rales, rhonchi, crackles.    Heart:  Regular rate and rhythm; no murmurs, clicks, rubs,  or gallops. Abdomen:  Soft, mild epigastric tenderness and nondistended. Normal bowel sounds, without guarding, and without rebound.   Extremities:  Without clubbing, deformity or edema. Neurologic:  Alert and  oriented x4;  grossly normal neurologically. Skin:  Intact without significant lesions or rashes. Psych:  Alert and cooperative. Normal mood and affect.  Intake/Output from previous day: 06/11 0701 - 06/12 0700 In: 4571.3 [I.V.:4571.3] Out: 1750 [Urine:1750] Intake/Output this shift:    Lab Results: CBC  Recent Labs  08/07/13 1748 08/08/13 0558  WBC 7.8 6.6  HGB 14.3 13.3  HCT 40.9 38.5  MCV 84.7 85.0  PLT 181 185   BMET  Recent Labs  08/07/13 1748 08/08/13 0558 08/09/13 0539  NA 138 141 141  K 3.1* 3.6* 4.3  CL 95* 100 105  CO2 26 30 23   GLUCOSE 209* 96 84  BUN 15 12 10   CREATININE 0.73 0.70 0.58  CALCIUM 9.7 8.9 8.7   LFTs  Recent Labs  08/07/13 1748 08/08/13 0558 08/09/13 0539  BILITOT 2.0* 1.5* 0.8  BILIDIR  --  0.5* <0.2  IBILI  --  1.0* NOT CALCULATED  ALKPHOS 321* 309* 243*  AST 566* 391* 120*  ALT 375* 397* 223*  PROT 7.1 6.2 5.9*  ALBUMIN 3.7 3.2* 2.9*    Recent Labs  08/07/13 1748 08/08/13 0558 08/09/13 0539   LIPASE >3000* 1324* 67*   PT/INR No results found for this basename: LABPROT, INR,  in the last 72 hours    Imaging Studies: Ct Abdomen Pelvis W Contrast  08/07/2013   CLINICAL DATA:  Abdominal pain and nausea.  EXAM: CT ABDOMEN AND PELVIS WITH CONTRAST  TECHNIQUE: Multidetector CT imaging of the abdomen and pelvis was performed using the standard protocol following bolus administration of intravenous contrast.  CONTRAST:  157mL OMNIPAQUE IOHEXOL 300 MG/ML  SOLN  COMPARISON:  Ultrasound 07/08/2013 and chest CT 01/04/2011  FINDINGS: Patchy densities at the lung bases may represent a combination of chronic changes and volume loss. No evidence for free air.  The gallbladder has been removed. Normal appearance of the liver and portal venous system. Extrahepatic bile duct measures up to 7 mm and normal for age and post cholecystectomy. There is a duodenum diverticula near the ampulla. Normal appearance of the spleen, adrenal glands and both kidneys. No gross abnormality to the pancreas. Calcified structures at the splenic hilum could represent small aneurysms. The largest measures 8 mm and unchanged since 2012. No free fluid or lymphadenopathy. 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 No significant free fluid. Uterus has been removed. Fluid in the urinary bladder.  There is extensive diverticulosis in the sigmoid colon without acute inflammation. No evidence for bowel dilatation or bowel obstruction. No gross abnormality to  the small bowel loops.  Degenerative facet disease in the lower lumbar spine. There is dextroscoliosis of the lumbar spine.  There are small lymph nodes near the porta hepatis. Index lesion measures 0.9 cm on sequence 2, image 23 and previously measured 0.8 cm.  IMPRESSION: No acute abnormality within the abdomen or pelvis.  Patchy densities at lung bases are nonspecific but favor atelectasis.  Post cholecystectomy.  Duodenal diverticulum.  Colonic diverticulosis without acute  inflammation.  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 Largest area measures 8 mm and unchanged since 2012.   Electronically Signed   By: Markus Daft M.D.   On: 08/07/2013 21:26  [2 weeks]   Assessment: 68 year old lady who presents with acute onset epigastric pain associated with nausea. Similar symptoms in the past intermittently over the course of the past couple of years per patient. Less severe however. Recent workup by PCP included abdominal ultrasound and MRI abdomen as outlined above. Current evaluation consistent with acute pancreatitis with suspicion for biliary etiology such as choledocholithiasis. Cannot rule out underlying occult pancreatic head mass however.  MRCP completed yesterday with no evidence of pancreatic mass or choledocholithiasis. Findings consistent with acute pancreatitis. LFTs and lipase improved.   Plan: 1. Clear liquid diet today. Patient really wants to go home today. She has not required any pain medication. May be reasonable to discharge patient on clear liquids for another day or so before advancing to low-fat diet. 2. She will need to have an endoscopic ultrasound for unexplained pancreatitis once current episode resolves. Plan for 4-6 weeks down the road. We will make arrangements.   LOS: 2 days   Neil Crouch  08/09/2013, 8:17 AM   Attending note:  Patient seen and examined. Agree with above assessment and recommendations. I suspect she can be discharged the next 24 hours. Agree with plans for EUS in about 4-6 weeks.

## 2013-08-29 NOTE — Anesthesia Postprocedure Evaluation (Signed)
  Anesthesia Post-op Note  Patient: [name]  Procedure(s) Performed: Procedure(s) (LRB): UPPER ENDOSCOPIC ULTRASOUND (EUS) LINEAR (N/A)  Patient Location: PACU  Anesthesia Type: MAC  Level of Consciousness: awake and alert   Airway and Oxygen Therapy: Patient Spontanous Breathing  Post-op Pain: mild  Post-op Assessment: Post-op Vital signs reviewed, Patient's Cardiovascular Status Stable, Respiratory Function Stable, Patent Airway and No signs of Nausea or vomiting  Last Vitals:  Filed Vitals:   08/29/13 0850  BP: 132/63  Pulse: 68  Temp:   Resp: 21    Post-op Vital Signs: stable   Complications: No apparent anesthesia complications

## 2013-08-30 ENCOUNTER — Encounter (HOSPITAL_COMMUNITY): Payer: Self-pay | Admitting: Gastroenterology

## 2013-09-05 LAB — IGG: IgG (Immunoglobin G), Serum: 792 mg/dL (ref 694–1618)

## 2013-09-05 LAB — IGG 1, 2, 3, AND 4
IGG 4: 5.5
IgG, Subclass 1: 431
IgG, Subclass 2: 237
IgG, Subclass 3: 52

## 2013-09-06 ENCOUNTER — Telehealth: Payer: Self-pay | Admitting: Gastroenterology

## 2013-09-06 NOTE — Telephone Encounter (Signed)
Have you reviewed the labs on this pt?

## 2013-09-10 ENCOUNTER — Encounter: Payer: Self-pay | Admitting: Gastroenterology

## 2013-09-10 ENCOUNTER — Ambulatory Visit (INDEPENDENT_AMBULATORY_CARE_PROVIDER_SITE_OTHER): Payer: Medicare Other | Admitting: Gastroenterology

## 2013-09-10 ENCOUNTER — Encounter (INDEPENDENT_AMBULATORY_CARE_PROVIDER_SITE_OTHER): Payer: Self-pay

## 2013-09-10 VITALS — BP 124/74 | HR 75 | Temp 97.9°F | Resp 18 | Ht 67.0 in | Wt 194.0 lb

## 2013-09-10 DIAGNOSIS — K859 Acute pancreatitis without necrosis or infection, unspecified: Secondary | ICD-10-CM

## 2013-09-10 DIAGNOSIS — K858 Other acute pancreatitis without necrosis or infection: Secondary | ICD-10-CM

## 2013-09-10 DIAGNOSIS — K589 Irritable bowel syndrome without diarrhea: Secondary | ICD-10-CM

## 2013-09-10 NOTE — Assessment & Plan Note (Signed)
Constipation predominant. Doing well on Amitiza 24 mcg at once daily. Return in 6 months.

## 2013-09-10 NOTE — Progress Notes (Signed)
Referring Provider: Glenda Chroman., MD Primary Care Physician:  Glenda Chroman., MD Primary GI: Dr. Gala Romney   Chief Complaint  Patient presents with  . Follow-up    HPI:   Returns today in follow-up with history of constipation. Hospitalized for acute pancreatitis in June 2015, felt to be possibly secondary to microlithiasis or HCTZ. EUS without significant findings. Some tenderness in epigastric area but improved. Tolerating diet. No N/V. Last few days some diarrhea but now improved. Amitiza 24 mcg once daily. BID is "too much". Now has Medicare Part D, so she is able to get the medication.   Past Medical History  Diagnosis Date  . Hyperlipidemia   . Shortness of breath     with exertion  . Hypothyroidism   . Arthritis   . GERD (gastroesophageal reflux disease)   . IBS (irritable bowel syndrome)   . Heart murmur     MILD, NO CARDIOLOGIST    Past Surgical History  Procedure Laterality Date  . Cesarean section  1967  . Tubal ligation  1970's  . Colonoscopy  2011    Dr. Arnoldo Morale: sigmoid diverticulosis, repeat Aug 2016 due to history of adenomatous polyps  . Esophagogastroduodenoscopy  2011    Dr. Arnoldo Morale: multiple gastric polyps, path with mild chronic gastritis  . Cholecystectomy  01/10/2011    Procedure: LAPAROSCOPIC CHOLECYSTECTOMY;  Surgeon: Scherry Ran;  Location: [facility];  Service: General;  Laterality: N/A;  . Cataract extraction w/phaco Left 05/03/2012    Procedure: CATARACT EXTRACTION PHACO AND INTRAOCULAR LENS PLACEMENT (IOC);  Surgeon: Tonny Branch, MD;  Location: [facility];  Service: Ophthalmology;  Laterality: Left;  CDE: 16.80  . Cataract extraction w/phaco Right 05/21/2012    Procedure: CATARACT EXTRACTION PHACO AND INTRAOCULAR LENS PLACEMENT (IOC);  Surgeon: Tonny Branch, MD;  Location: [facility];  Service: Ophthalmology;  Laterality: Right;  CDE=12.31  . Colonoscopy N/A 02/04/2013    Dr. Gala Romney :Colonic diverticulosis, repeat in 2019  . Esophagogastroduodenoscopy  N/A 02/04/2013    Dr. Rourk:Small hiatal hernia. Gastric polyps s/p bx, negative H.pylori. Benign path.   . Abdominal hysterectomy  2003    OVARIES REMOVED  . Eus N/A 08/29/2013    Dr. Ardis Hughs: no clear cause for acute pancreatitis. Autoimmune labs normal. Question HCTZ as culprit    Current Outpatient Prescriptions  Medication Sig Dispense Refill  . acetaminophen (TYLENOL) 500 MG tablet Take 1,000 mg by mouth every 6 (six) hours as needed for mild pain or moderate pain.      Marland Kitchen levothyroxine (SYNTHROID, LEVOTHROID) 88 MCG tablet Take 88 mcg by mouth daily before breakfast.       . lubiprostone (AMITIZA) 24 MCG capsule Take 1 capsule (24 mcg total) by mouth daily with breakfast.  30 capsule  5  . pantoprazole (PROTONIX) 40 MG tablet Take 40 mg by mouth daily.      . polyethylene glycol powder (GLYCOLAX/MIRALAX) powder Take 17 g by mouth at bedtime.      . propranolol (INDERAL) 80 MG tablet Take 80 mg by mouth every evening.        No current facility-administered medications for this visit.    Allergies as of 09/10/2013 - Review Complete 09/10/2013  Allergen Reaction Noted  . Metoclopramide hcl Other (See Comments) 01/04/2011  . Aspirin  01/28/2013    Family History  Problem Relation Age of Onset  . Anesthesia problems Neg Hx   . Hypotension Neg Hx   . Malignant hyperthermia Neg Hx   .  Pseudochol deficiency Neg Hx   . Colon cancer Neg Hx   . Pancreatitis Neg Hx     History   Social History  . Marital Status: Married    Spouse Name: N/A    Number of Children: N/A  . Years of Education: N/A   Social History Main Topics  . Smoking status: Never Smoker   . Smokeless tobacco: Never Used  . Alcohol Use: No  . Drug Use: No  . Sexual Activity: Yes    Birth Control/ Protection: None   Other Topics Concern  . None   Social History Narrative  . None    Review of Systems: As mentioned in HPI.   Physical Exam: BP 124/74  Pulse 75  Temp(Src) 97.9 F (36.6 C) (Oral)   Resp 18  Ht 5\' 7"  (1.702 m)  Wt 194 lb (87.998 kg)  BMI 30.38 kg/m2 General:   Alert and oriented. No distress noted. Pleasant and cooperative.  Head:  Normocephalic and atraumatic. Eyes:  Conjuctiva clear without scleral icterus. Mouth:  Oral mucosa pink and moist. Good dentition. No lesions. Abdomen:  +BS, soft, very mild discomfort with palpation upper abdomen, non-distended. Likely umbilical hernia noted. Extremities:  1-2+ lower extremity pedal edema Neurologic:  Alert and  oriented x4;  grossly normal neurologically. Skin:  Intact without significant lesions or rashes. Psych:  Alert and cooperative. Normal mood and affect.  Lab Results  Component Value Date   ALT 223* 08/09/2013   AST 120* 08/09/2013   ALKPHOS 243* 08/09/2013   BILITOT 0.8 08/09/2013

## 2013-09-10 NOTE — Patient Instructions (Signed)
Please have blood work done when you are able.   We will see you back in 6 months. I am sorry to hear about your loss.   Continue Amitiza daily with food.

## 2013-09-10 NOTE — Assessment & Plan Note (Signed)
Uncomplicated, with negative EUS. Autoimmune labs normal. ?HCTZ component, possible microlithiasis. Abstain from HCTZ. Recheck LFTs now to ensure trend to baseline. Symptomatically improved.

## 2013-09-11 LAB — HEPATIC FUNCTION PANEL
ALK PHOS: 97 U/L (ref 39–117)
ALT: 29 U/L (ref 0–35)
AST: 15 U/L (ref 0–37)
Albumin: 3.9 g/dL (ref 3.5–5.2)
BILIRUBIN INDIRECT: 0.8 mg/dL (ref 0.2–1.2)
Bilirubin, Direct: 0.1 mg/dL (ref 0.0–0.3)
Total Bilirubin: 0.9 mg/dL (ref 0.2–1.2)
Total Protein: 6.3 g/dL (ref 6.0–8.3)

## 2013-09-11 NOTE — Progress Notes (Signed)
cc'd to pcp 

## 2013-09-18 NOTE — Progress Notes (Signed)
Quick Note:  LFTs completely normalized.   ______

## 2013-11-28 NOTE — Telephone Encounter (Signed)
See results note. 

## 2014-02-25 ENCOUNTER — Encounter: Payer: Self-pay | Admitting: Internal Medicine

## 2014-03-04 ENCOUNTER — Other Ambulatory Visit: Payer: Self-pay

## 2014-03-06 MED ORDER — LUBIPROSTONE 24 MCG PO CAPS: 24.0000 ug | ORAL_CAPSULE | Freq: Every day | ORAL | Status: DC

## 2014-04-09 ENCOUNTER — Encounter: Payer: Self-pay | Admitting: Gastroenterology

## 2014-04-09 ENCOUNTER — Ambulatory Visit (INDEPENDENT_AMBULATORY_CARE_PROVIDER_SITE_OTHER): Payer: Medicare Other | Admitting: Gastroenterology

## 2014-04-09 VITALS — BP 137/78 | HR 65 | Temp 98.1°F | Ht 66.0 in | Wt 195.0 lb

## 2014-04-09 DIAGNOSIS — K219 Gastro-esophageal reflux disease without esophagitis: Secondary | ICD-10-CM

## 2014-04-09 DIAGNOSIS — K589 Irritable bowel syndrome without diarrhea: Secondary | ICD-10-CM

## 2014-04-09 MED ORDER — PANTOPRAZOLE SODIUM 40 MG PO TBEC: 40.0000 mg | DELAYED_RELEASE_TABLET | Freq: Every day | ORAL | Status: DC

## 2014-04-09 NOTE — Progress Notes (Signed)
Referring Provider: Glenda Chroman., MD Primary Care Physician:  Glenda Chroman., MD  Primary GI: Dr. Gala Romney   Chief Complaint  Patient presents with  . Follow-up  . Medication Refill    HPI:   [name] is a 69 y.o. female presenting today with a history of GERD and constipation. Routine 6 month follow-up. Due for surveillance colonoscopy in 2019.   Takes Amitiza 24 mcg daily as needed. No rectal bleeding. Some indigestion, ran out of pills. No dysphagia. Good appetite.   Past Medical History  Diagnosis Date  . Hyperlipidemia   . Shortness of breath     with exertion  . Hypothyroidism   . Arthritis   . GERD (gastroesophageal reflux disease)   . IBS (irritable bowel syndrome)   . Heart murmur     MILD, NO CARDIOLOGIST    Past Surgical History  Procedure Laterality Date  . Cesarean section  1967  . Tubal ligation  1970's  . Colonoscopy  2011    Dr. Arnoldo Morale: sigmoid diverticulosis, repeat Aug 2016 due to history of adenomatous polyps  . Esophagogastroduodenoscopy  2011    Dr. Arnoldo Morale: multiple gastric polyps, path with mild chronic gastritis  . Cholecystectomy  01/10/2011    Procedure: LAPAROSCOPIC CHOLECYSTECTOMY;  Surgeon: Scherry Ran;  Location: [facility];  Service: General;  Laterality: N/A;  . Cataract extraction w/phaco Left 05/03/2012    Procedure: CATARACT EXTRACTION PHACO AND INTRAOCULAR LENS PLACEMENT (IOC);  Surgeon: Tonny Branch, MD;  Location: [facility];  Service: Ophthalmology;  Laterality: Left;  CDE: 16.80  . Cataract extraction w/phaco Right 05/21/2012    Procedure: CATARACT EXTRACTION PHACO AND INTRAOCULAR LENS PLACEMENT (IOC);  Surgeon: Tonny Branch, MD;  Location: [facility];  Service: Ophthalmology;  Laterality: Right;  CDE=12.31  . Colonoscopy N/A 02/04/2013    Dr. Gala Romney :Colonic diverticulosis, repeat in 2019  . Esophagogastroduodenoscopy N/A 02/04/2013    Dr. Rourk:Small hiatal hernia. Gastric polyps s/p bx, negative H.pylori. Benign path.   .  Abdominal hysterectomy  2003    OVARIES REMOVED  . Eus N/A 08/29/2013    Dr. Ardis Hughs: no clear cause for acute pancreatitis. Autoimmune labs normal. Question HCTZ as culprit    Current Outpatient Prescriptions  Medication Sig Dispense Refill  . acetaminophen (TYLENOL) 500 MG tablet Take 1,000 mg by mouth every 6 (six) hours as needed for mild pain or moderate pain.    Marland Kitchen levothyroxine (SYNTHROID, LEVOTHROID) 88 MCG tablet Take 88 mcg by mouth daily before breakfast.     . lubiprostone (AMITIZA) 24 MCG capsule Take 1 capsule (24 mcg total) by mouth daily with breakfast. 60 capsule 3  . pantoprazole (PROTONIX) 40 MG tablet Take 40 mg by mouth daily.    . polyethylene glycol powder (GLYCOLAX/MIRALAX) powder Take 17 g by mouth at bedtime.    . propranolol (INDERAL) 80 MG tablet Take 80 mg by mouth every evening.      No current facility-administered medications for this visit.    Allergies as of 04/09/2014 - Review Complete 09/10/2013  Allergen Reaction Noted  . Metoclopramide hcl Other (See Comments) 01/04/2011  . Aspirin  01/28/2013    Family History  Problem Relation Age of Onset  . Anesthesia problems Neg Hx   . Hypotension Neg Hx   . Malignant hyperthermia Neg Hx   . Pseudochol deficiency Neg Hx   . Colon cancer Neg Hx   . Pancreatitis Neg Hx     History   Social History  .  Marital Status: Married    Spouse Name: N/A  . Number of Children: N/A  . Years of Education: N/A   Social History Main Topics  . Smoking status: Never Smoker   . Smokeless tobacco: Never Used  . Alcohol Use: No  . Drug Use: No  . Sexual Activity: Yes    Birth Control/ Protection: None   Other Topics Concern  . None   Social History Narrative    Review of Systems: Gen: +chills, no fever  CV: Denies chest pain, palpitations, syncope, peripheral edema, and claudication. Resp: Denies dyspnea at rest, cough, wheezing, coughing up blood, and pleurisy. GI: see HPI Derm: Denies rash, itching, dry  skin Psych: Denies depression, anxiety, memory loss, confusion. No homicidal or suicidal ideation.  Heme: Denies bruising, bleeding, and enlarged lymph nodes.  Physical Exam: BP 137/78 mmHg  Pulse 65  Temp(Src) 98.1 F (36.7 C) (Oral)  Ht 5\' 6"  (1.676 m)  Wt 195 lb (88.451 kg)  BMI 31.49 kg/m2 General:   Alert and oriented. No distress noted. Pleasant and cooperative.  Head:  Normocephalic and atraumatic. Eyes:  Conjuctiva clear without scleral icterus. Mouth:  Oral mucosa pink and moist. Good dentition. No lesions. Heart:  S1, S2 present with soft systolic murmur. Regular rate and rhythm. Abdomen:  +BS, soft, non-tender and non-distended. No rebound or guarding. No HSM or masses noted. Small umbilical hernia noted.  Msk:  Symmetrical without gross deformities. Normal posture. Extremities:  Without edema. Neurologic:  Alert and  oriented x4;  grossly normal neurologically. Psych:  Alert and cooperative. Normal mood and affect.

## 2014-04-09 NOTE — Assessment & Plan Note (Signed)
Amitiza 24 mcg as needed working well for patient. Continue current regimen. Next surveillance colonoscopy in 2019. Return in 1 year.

## 2014-04-09 NOTE — Patient Instructions (Signed)
Continue taking Protonix once daily, 30 minutes before breakfast. I have sent refills to your pharmacy.   Continue Amitiza as needed.   Return in 1 year.

## 2014-04-09 NOTE — Assessment & Plan Note (Signed)
69 year old female doing well on Protonix daily; recent exacerbation of symptoms likely secondary to lack of PPI (ran out of refills). Resume Protonix once daily. Refills for 1 year provided. Return in 1 year.

## 2014-04-19 NOTE — Progress Notes (Signed)
CC'ED TO PCP 

## 2014-10-29 ENCOUNTER — Other Ambulatory Visit: Payer: Self-pay | Admitting: Orthopedic Surgery

## 2014-10-30 NOTE — Pre-Procedure Instructions (Signed)
[name]  10/30/2014      [facility] [address] [place] - [address] [place] [address] Phone: [phone] Fax: [phone]    Your procedure is scheduled on  Sept 13.  Report to [name] Admitting at 530 A.M.  Call this number if you have problems the morning of surgery:  [phone]   Remember:  Do not eat food or drink liquids after midnight.  Take these medicines the morning of surgery with A SIP OF WATER:  Tylenol if needed, Levothyroxine (Synthroid), Pantoprazole (protonix)   Stop taking Aspirin, Ibuprofen, BC's, Goody's, Herbal medications, Fish Oil   Do not wear jewelry, make-up or nail polish.  Do not wear lotions, powders, or perfumes.  You may wear deodorant.  Do not shave 48 hours prior to surgery.  Men may shave face and neck.  Do not bring valuables to the [facility].  [name] is not responsible for any belongings or valuables.  Contacts, dentures or bridgework may not be worn into surgery.  Leave your suitcase in the car.  After surgery it may be brought to your room.  For patients admitted to the [facility], discharge time will be determined by your treatment team.  Patients discharged the day of surgery will not be allowed to drive home.    Special instructions: [place] - Preparing for Surgery  Before surgery, you can play an important role.  Because skin is not sterile, your skin needs to be as free of germs as possible.  You can reduce the number of germs on you skin by washing with CHG (chlorahexidine gluconate) soap before surgery.  CHG is an antiseptic cleaner which kills germs and bonds with the skin to continue killing germs even after washing.  Please DO NOT use if you have an allergy to CHG or antibacterial soaps.  If your skin becomes reddened/irritated stop using the CHG and inform your nurse when you arrive at [name].  Do not shave (including legs and underarms) for at least 48 hours  prior to the first CHG shower.  You may shave your face.  Please follow these instructions carefully:   1.  Shower with CHG Soap the night before surgery and the  morning of Surgery.  2.  If you choose to wash your hair, wash your hair first as usual with your  normal shampoo.  3.  After you shampoo, rinse your hair and body thoroughly to remove the   Shampoo.  4.  Use CHG as you would any other liquid soap.  You can apply chg directly  to the skin and wash gently with scrungie or a clean washcloth.  5.  Apply the CHG Soap to your body ONLY FROM THE NECK DOWN.   Do not use on open wounds or open sores.  Avoid contact with your eyes,  ears, mouth and genitals (private parts).  Wash genitals (private parts)   with your normal soap.  6.  Wash thoroughly, paying special attention to the area where your surgery  will be performed.  7.  Thoroughly rinse your body with warm water from the neck down.  8.  DO NOT shower/wash with your normal soap after using and rinsing off  the CHG Soap.  9.  Pat yourself dry with a clean towel.            10.  Wear clean pajamas.  11.  Place clean sheets on your bed the night of your first shower and do not  sleep with pets.  Day of Surgery  Do not apply any lotions/deoderants the morning of surgery.  Please wear clean clothes to [facility]surgery center.     Please read over the following fact sheets that you were given. Pain Booklet, Coughing and Deep Breathing, MRSA Information and Surgical Site Infection Prevention

## 2014-10-31 ENCOUNTER — Encounter (HOSPITAL_COMMUNITY)
Admission: RE | Admit: 2014-10-31 | Discharge: 2014-10-31 | Disposition: A | Discharge status: Home / Self Care | Payer: Medicare Other | Source: Ambulatory Visit | Attending: Orthopedic Surgery | Admitting: Orthopedic Surgery

## 2014-10-31 ENCOUNTER — Encounter (HOSPITAL_COMMUNITY): Payer: Self-pay

## 2014-10-31 DIAGNOSIS — Z01818 Encounter for other preprocedural examination: Secondary | ICD-10-CM | POA: Diagnosis not present

## 2014-10-31 DIAGNOSIS — Z79899 Other long term (current) drug therapy: Secondary | ICD-10-CM | POA: Insufficient documentation

## 2014-10-31 DIAGNOSIS — E785 Hyperlipidemia, unspecified: Secondary | ICD-10-CM | POA: Diagnosis not present

## 2014-10-31 DIAGNOSIS — Z01812 Encounter for preprocedural laboratory examination: Secondary | ICD-10-CM | POA: Diagnosis not present

## 2014-10-31 DIAGNOSIS — M1611 Unilateral primary osteoarthritis, right hip: Secondary | ICD-10-CM | POA: Insufficient documentation

## 2014-10-31 DIAGNOSIS — E039 Hypothyroidism, unspecified: Secondary | ICD-10-CM | POA: Diagnosis not present

## 2014-10-31 DIAGNOSIS — K219 Gastro-esophageal reflux disease without esophagitis: Secondary | ICD-10-CM | POA: Insufficient documentation

## 2014-10-31 HISTORY — DX: Major depressive disorder, single episode, unspecified: F32.9

## 2014-10-31 HISTORY — DX: Diverticulitis of intestine, part unspecified, without perforation or abscess without bleeding: K57.92

## 2014-10-31 HISTORY — DX: Pneumonia, unspecified organism: J18.9

## 2014-10-31 HISTORY — DX: Depression, unspecified: F32.A

## 2014-10-31 HISTORY — DX: Umbilical hernia without obstruction or gangrene: K42.9

## 2014-10-31 LAB — BASIC METABOLIC PANEL
Anion gap: 7 (ref 5–15)
BUN: 7 mg/dL (ref 6–20)
CALCIUM: 9.2 mg/dL (ref 8.9–10.3)
CO2: 27 mmol/L (ref 22–32)
Chloride: 104 mmol/L (ref 101–111)
Creatinine, Ser: 0.71 mg/dL (ref 0.44–1.00)
GFR calc Af Amer: >60 mL/min (ref >60)
GLUCOSE: 110 mg/dL — AB (ref 65–99)
Potassium: 3.7 mmol/L (ref 3.5–5.1)
Sodium: 138 mmol/L (ref 135–145)

## 2014-10-31 LAB — CBC
HCT: 42 % (ref 36.0–46.0)
Hemoglobin: 14.1 g/dL (ref 12.0–15.0)
MCH: 29.7 pg (ref 26.0–34.0)
MCHC: 33.6 g/dL (ref 30.0–36.0)
MCV: 88.4 fL (ref 78.0–100.0)
Platelets: 198 10*3/uL (ref 150–400)
RBC: 4.75 MIL/uL (ref 3.87–5.11)
RDW: 12.4 % (ref 11.5–15.5)
WBC: 6.8 10*3/uL (ref 4.0–10.5)

## 2014-10-31 LAB — SURGICAL PCR SCREEN
MRSA, PCR: NEGATIVE
STAPHYLOCOCCUS AUREUS: NEGATIVE

## 2014-10-31 NOTE — Progress Notes (Signed)
Pt states she has a heart murmer, but doesn't see a cardiologist. PCP is Dr. Woody Seller States she had an EKG done a week or two ago at Dr Santina Evans sent for results. Pt doesn't remember having any further heart studies. Stress test and echo noted under care everywhere, but unable to view results. Pt states she takes Inderal for hand tremors.

## 2014-11-04 NOTE — Progress Notes (Signed)
Anesthesia Chart Review:  Pt is 69 year old female scheduled for R total hip arthroplasty on 11/11/2014 with Dr. Mardelle Matte.   PMH includes: Heart murmur (unspecified), hyperlipidemia, hypothyroidism, GERD. Never smoker. BMI 30. S/p cataract extraction 05/03/12 and 05/22/14. S/p laparoscopic cholecystectomy 01/10/11.   Medications include: levothyroxine, protonix, propranolol (takes for hand tremors).   Preoperative labs reviewed.    EKG pending from PCP's office.   Echo 07/08/2013:  -LV chamber size, wall thickness and systolic function are within normal limits. No wall motion abnormalities observed. EF is 60-65% -aortic valve structure normal -mitral valve leaflets appear normal -tricuspid valved leaflets normal -pulmonic valve is not well visualized  Will revisit chart when EKG available.   Willeen Cass, FNP-BC [facility]/Anesthesiology Phone: [phone] 11/04/2014 2:50 PM

## 2014-11-06 NOTE — Progress Notes (Signed)
Requested ekg from Dr. Carman Ching.

## 2014-11-11 ENCOUNTER — Inpatient Hospital Stay (HOSPITAL_COMMUNITY): Payer: Medicare Other | Admitting: Certified Registered Nurse Anesthetist

## 2014-11-11 ENCOUNTER — Inpatient Hospital Stay (HOSPITAL_COMMUNITY)
Admission: RE | Admit: 2014-11-11 | Discharge: 2014-11-13 | DRG: 470 | Disposition: A | Discharge status: Home Health | Payer: Medicare Other | Source: Ambulatory Visit | Attending: Orthopedic Surgery | Admitting: Orthopedic Surgery

## 2014-11-11 ENCOUNTER — Encounter (HOSPITAL_COMMUNITY): Payer: Self-pay | Admitting: *Deleted

## 2014-11-11 ENCOUNTER — Inpatient Hospital Stay (HOSPITAL_COMMUNITY): Payer: Medicare Other

## 2014-11-11 ENCOUNTER — Encounter (HOSPITAL_COMMUNITY)
Admission: RE | Disposition: A | Discharge status: Home Health | Payer: Self-pay | Source: Ambulatory Visit | Attending: Orthopedic Surgery

## 2014-11-11 ENCOUNTER — Inpatient Hospital Stay (HOSPITAL_COMMUNITY): Payer: Medicare Other | Admitting: Emergency Medicine

## 2014-11-11 DIAGNOSIS — M1611 Unilateral primary osteoarthritis, right hip: Secondary | ICD-10-CM | POA: Diagnosis present

## 2014-11-11 DIAGNOSIS — K589 Irritable bowel syndrome without diarrhea: Secondary | ICD-10-CM | POA: Diagnosis present

## 2014-11-11 DIAGNOSIS — K219 Gastro-esophageal reflux disease without esophagitis: Secondary | ICD-10-CM | POA: Diagnosis present

## 2014-11-11 DIAGNOSIS — E039 Hypothyroidism, unspecified: Secondary | ICD-10-CM | POA: Diagnosis present

## 2014-11-11 DIAGNOSIS — F329 Major depressive disorder, single episode, unspecified: Secondary | ICD-10-CM | POA: Diagnosis present

## 2014-11-11 DIAGNOSIS — E785 Hyperlipidemia, unspecified: Secondary | ICD-10-CM | POA: Diagnosis present

## 2014-11-11 DIAGNOSIS — M1610 Unilateral primary osteoarthritis, unspecified hip: Secondary | ICD-10-CM

## 2014-11-11 HISTORY — DX: Unilateral primary osteoarthritis, right hip: M16.11

## 2014-11-11 HISTORY — PX: TOTAL HIP ARTHROPLASTY: SHX124

## 2014-11-11 SURGERY — ARTHROPLASTY, HIP, TOTAL,POSTERIOR APPROACH
Anesthesia: General | Site: Hip | Laterality: Right

## 2014-11-11 MED ORDER — OXYCODONE-ACETAMINOPHEN 10-325 MG PO TABS: 1.0000 | ORAL_TABLET | Freq: Four times a day (QID) | ORAL | Status: DC | PRN

## 2014-11-11 MED ORDER — PROMETHAZINE HCL 25 MG/ML IJ SOLN
6.2500 mg | INTRAMUSCULAR | Status: DC | PRN
  Administered 2014-11-11: 6.25 mg via INTRAVENOUS

## 2014-11-11 MED ORDER — LEVOTHYROXINE SODIUM 88 MCG PO TABS
88.0000 ug | ORAL_TABLET | Freq: Every day | ORAL | Status: DC
  Administered 2014-11-12 – 2014-11-13 (×2): 88 ug via ORAL
  Filled 2014-11-11 (×2): qty 1

## 2014-11-11 MED ORDER — PROPOFOL 10 MG/ML IV BOLUS
INTRAVENOUS | Status: DC | PRN
  Administered 2014-11-11: 140 mg via INTRAVENOUS

## 2014-11-11 MED ORDER — DOCUSATE SODIUM 100 MG PO CAPS
100.0000 mg | ORAL_CAPSULE | Freq: Two times a day (BID) | ORAL | Status: DC
  Administered 2014-11-12 – 2014-11-13 (×3): 100 mg via ORAL
  Filled 2014-11-11 (×4): qty 1

## 2014-11-11 MED ORDER — HYDROMORPHONE HCL 1 MG/ML IJ SOLN: 0.5000 mg | INTRAMUSCULAR | Status: DC | PRN

## 2014-11-11 MED ORDER — FENTANYL CITRATE (PF) 100 MCG/2ML IJ SOLN
INTRAMUSCULAR | Status: DC | PRN
  Administered 2014-11-11 (×2): 50 ug via INTRAVENOUS
  Administered 2014-11-11: 200 ug via INTRAVENOUS
  Administered 2014-11-11 (×2): 50 ug via INTRAVENOUS

## 2014-11-11 MED ORDER — SENNA-DOCUSATE SODIUM 8.6-50 MG PO TABS: 2.0000 | ORAL_TABLET | Freq: Every day | ORAL | Status: DC

## 2014-11-11 MED ORDER — DEXAMETHASONE SODIUM PHOSPHATE 10 MG/ML IJ SOLN
10.0000 mg | Freq: Once | INTRAMUSCULAR | Status: DC
  Filled 2014-11-11: qty 1

## 2014-11-11 MED ORDER — FENTANYL CITRATE (PF) 250 MCG/5ML IJ SOLN
INTRAMUSCULAR | Status: AC
  Filled 2014-11-11: qty 5

## 2014-11-11 MED ORDER — POTASSIUM CHLORIDE IN NACL 20-0.45 MEQ/L-% IV SOLN
INTRAVENOUS | Status: DC
  Administered 2014-11-11: 18:00:00 via INTRAVENOUS
  Filled 2014-11-11 (×5): qty 1000

## 2014-11-11 MED ORDER — RIVAROXABAN 10 MG PO TABS: 10.0000 mg | ORAL_TABLET | Freq: Every day | ORAL | Status: DC

## 2014-11-11 MED ORDER — MENTHOL 3 MG MT LOZG: 1.0000 | LOZENGE | OROMUCOSAL | Status: DC | PRN

## 2014-11-11 MED ORDER — ALUM & MAG HYDROXIDE-SIMETH 200-200-20 MG/5ML PO SUSP: 30.0000 mL | ORAL | Status: DC | PRN

## 2014-11-11 MED ORDER — SUCCINYLCHOLINE CHLORIDE 20 MG/ML IJ SOLN
INTRAMUSCULAR | Status: AC
  Filled 2014-11-11: qty 1

## 2014-11-11 MED ORDER — NEOSTIGMINE METHYLSULFATE 10 MG/10ML IV SOLN
INTRAVENOUS | Status: DC | PRN
  Administered 2014-11-11: 5 mg via INTRAVENOUS

## 2014-11-11 MED ORDER — ROCURONIUM BROMIDE 100 MG/10ML IV SOLN
INTRAVENOUS | Status: DC | PRN
  Administered 2014-11-11: 50 mg via INTRAVENOUS

## 2014-11-11 MED ORDER — ACETAMINOPHEN 325 MG PO TABS
650.0000 mg | ORAL_TABLET | Freq: Four times a day (QID) | ORAL | Status: DC | PRN
  Administered 2014-11-13: 650 mg via ORAL
  Filled 2014-11-11: qty 2

## 2014-11-11 MED ORDER — MIDAZOLAM HCL 5 MG/5ML IJ SOLN
INTRAMUSCULAR | Status: DC | PRN
  Administered 2014-11-11: 2 mg via INTRAVENOUS

## 2014-11-11 MED ORDER — SENNA 8.6 MG PO TABS
1.0000 | ORAL_TABLET | Freq: Two times a day (BID) | ORAL | Status: DC
  Administered 2014-11-12 – 2014-11-13 (×3): 8.6 mg via ORAL
  Filled 2014-11-11 (×4): qty 1

## 2014-11-11 MED ORDER — GLYCOPYRROLATE 0.2 MG/ML IJ SOLN
INTRAMUSCULAR | Status: DC | PRN
  Administered 2014-11-11: 0.6 mg via INTRAVENOUS

## 2014-11-11 MED ORDER — BUPIVACAINE HCL (PF) 0.25 % IJ SOLN
INTRAMUSCULAR | Status: AC
  Filled 2014-11-11: qty 30

## 2014-11-11 MED ORDER — POLYETHYLENE GLYCOL 3350 17 GM/SCOOP PO POWD
17.0000 g | Freq: Every day | ORAL | Status: DC
  Filled 2014-11-11: qty 255

## 2014-11-11 MED ORDER — CEFAZOLIN SODIUM-DEXTROSE 2-3 GM-% IV SOLR
INTRAVENOUS | Status: AC
  Administered 2014-11-11: 2 g via INTRAVENOUS
  Filled 2014-11-11: qty 50

## 2014-11-11 MED ORDER — PHENOL 1.4 % MT LIQD: 1.0000 | OROMUCOSAL | Status: DC | PRN

## 2014-11-11 MED ORDER — MAGNESIUM CITRATE PO SOLN: 1.0000 | Freq: Once | ORAL | Status: DC | PRN

## 2014-11-11 MED ORDER — ONDANSETRON HCL 4 MG PO TABS
4.0000 mg | ORAL_TABLET | Freq: Four times a day (QID) | ORAL | Status: DC | PRN
  Administered 2014-11-13: 4 mg via ORAL
  Filled 2014-11-11: qty 1

## 2014-11-11 MED ORDER — POLYETHYLENE GLYCOL 3350 17 G PO PACK: 17.0000 g | PACK | Freq: Every day | ORAL | Status: DC | PRN

## 2014-11-11 MED ORDER — ONDANSETRON HCL 4 MG/2ML IJ SOLN: 4.0000 mg | Freq: Four times a day (QID) | INTRAMUSCULAR | Status: DC | PRN

## 2014-11-11 MED ORDER — PROPOFOL 10 MG/ML IV BOLUS
INTRAVENOUS | Status: AC
  Filled 2014-11-11: qty 20

## 2014-11-11 MED ORDER — POLYETHYLENE GLYCOL 3350 17 G PO PACK
17.0000 g | PACK | Freq: Every day | ORAL | Status: DC
  Administered 2014-11-11 – 2014-11-12 (×2): 17 g via ORAL
  Filled 2014-11-11 (×2): qty 1

## 2014-11-11 MED ORDER — CEFAZOLIN SODIUM-DEXTROSE 2-3 GM-% IV SOLR: 2.0000 g | INTRAVENOUS | Status: DC

## 2014-11-11 MED ORDER — MEPERIDINE HCL 25 MG/ML IJ SOLN: 6.2500 mg | INTRAMUSCULAR | Status: DC | PRN

## 2014-11-11 MED ORDER — ONDANSETRON HCL 4 MG/2ML IJ SOLN
INTRAMUSCULAR | Status: DC | PRN
  Administered 2014-11-11: 4 mg via INTRAVENOUS

## 2014-11-11 MED ORDER — DEXAMETHASONE SODIUM PHOSPHATE 4 MG/ML IJ SOLN
INTRAMUSCULAR | Status: DC | PRN
  Administered 2014-11-11: 8 mg via INTRAVENOUS

## 2014-11-11 MED ORDER — PROPRANOLOL HCL ER 80 MG PO CP24
80.0000 mg | ORAL_CAPSULE | Freq: Once | ORAL | Status: AC
Start: 2014-11-11 — End: 2014-11-11
  Administered 2014-11-11: 80 mg via ORAL
  Filled 2014-11-11: qty 1

## 2014-11-11 MED ORDER — MIDAZOLAM HCL 2 MG/2ML IJ SOLN
INTRAMUSCULAR | Status: AC
  Filled 2014-11-11: qty 4

## 2014-11-11 MED ORDER — BISACODYL 10 MG RE SUPP: 10.0000 mg | Freq: Every day | RECTAL | Status: DC | PRN

## 2014-11-11 MED ORDER — ONDANSETRON HCL 4 MG/2ML IJ SOLN
INTRAMUSCULAR | Status: AC
  Filled 2014-11-11: qty 2

## 2014-11-11 MED ORDER — ZOLPIDEM TARTRATE 5 MG PO TABS: 5.0000 mg | ORAL_TABLET | Freq: Every evening | ORAL | Status: DC | PRN

## 2014-11-11 MED ORDER — OXYCODONE HCL 5 MG PO TABS
5.0000 mg | ORAL_TABLET | ORAL | Status: DC | PRN
  Administered 2014-11-12 – 2014-11-13 (×4): 10 mg via ORAL
  Filled 2014-11-11 (×5): qty 2

## 2014-11-11 MED ORDER — NEOSTIGMINE METHYLSULFATE 10 MG/10ML IV SOLN
INTRAVENOUS | Status: AC
  Filled 2014-11-11: qty 1

## 2014-11-11 MED ORDER — METHOCARBAMOL 500 MG PO TABS
500.0000 mg | ORAL_TABLET | Freq: Four times a day (QID) | ORAL | Status: DC | PRN
Start: 2014-11-11 — End: 2014-11-13
  Administered 2014-11-12 – 2014-11-13 (×4): 500 mg via ORAL
  Filled 2014-11-11 (×4): qty 1

## 2014-11-11 MED ORDER — CEFAZOLIN SODIUM-DEXTROSE 2-3 GM-% IV SOLR
2.0000 g | Freq: Four times a day (QID) | INTRAVENOUS | Status: AC
  Administered 2014-11-11 (×2): 2 g via INTRAVENOUS
  Filled 2014-11-11 (×3): qty 50

## 2014-11-11 MED ORDER — PROMETHAZINE HCL 25 MG/ML IJ SOLN
INTRAMUSCULAR | Status: AC
  Filled 2014-11-11: qty 1

## 2014-11-11 MED ORDER — PROPRANOLOL HCL 80 MG PO TABS
80.0000 mg | ORAL_TABLET | Freq: Every evening | ORAL | Status: DC
  Filled 2014-11-11 (×3): qty 1

## 2014-11-11 MED ORDER — RIVAROXABAN 10 MG PO TABS
10.0000 mg | ORAL_TABLET | Freq: Every day | ORAL | Status: DC
  Administered 2014-11-12 – 2014-11-13 (×2): 10 mg via ORAL
  Filled 2014-11-11 (×2): qty 1

## 2014-11-11 MED ORDER — DEXAMETHASONE SODIUM PHOSPHATE 4 MG/ML IJ SOLN
INTRAMUSCULAR | Status: AC
  Filled 2014-11-11: qty 2

## 2014-11-11 MED ORDER — HYDROMORPHONE HCL 1 MG/ML IJ SOLN
INTRAMUSCULAR | Status: AC
  Filled 2014-11-11: qty 1

## 2014-11-11 MED ORDER — BUPIVACAINE HCL (PF) 0.25 % IJ SOLN
INTRAMUSCULAR | Status: DC | PRN
  Administered 2014-11-11: 20 mL

## 2014-11-11 MED ORDER — PANTOPRAZOLE SODIUM 40 MG PO TBEC
40.0000 mg | DELAYED_RELEASE_TABLET | Freq: Every day | ORAL | Status: DC
  Administered 2014-11-12 – 2014-11-13 (×2): 40 mg via ORAL
  Filled 2014-11-11 (×2): qty 1

## 2014-11-11 MED ORDER — ACETAMINOPHEN 650 MG RE SUPP: 650.0000 mg | Freq: Four times a day (QID) | RECTAL | Status: DC | PRN

## 2014-11-11 MED ORDER — SODIUM CHLORIDE 0.9 % IR SOLN
Status: DC | PRN
  Administered 2014-11-11: 1000 mL

## 2014-11-11 MED ORDER — GLYCOPYRROLATE 0.2 MG/ML IJ SOLN
INTRAMUSCULAR | Status: AC
  Filled 2014-11-11: qty 3

## 2014-11-11 MED ORDER — KETOROLAC TROMETHAMINE 15 MG/ML IJ SOLN
7.5000 mg | Freq: Four times a day (QID) | INTRAMUSCULAR | Status: AC
  Administered 2014-11-11 – 2014-11-12 (×3): 7.5 mg via INTRAVENOUS
  Filled 2014-11-11 (×4): qty 1

## 2014-11-11 MED ORDER — BACLOFEN 10 MG PO TABS: 10.0000 mg | ORAL_TABLET | Freq: Three times a day (TID) | ORAL | Status: DC

## 2014-11-11 MED ORDER — LUBIPROSTONE 24 MCG PO CAPS
24.0000 ug | ORAL_CAPSULE | Freq: Every day | ORAL | Status: DC
  Administered 2014-11-12 – 2014-11-13 (×2): 24 ug via ORAL
  Filled 2014-11-11 (×3): qty 1

## 2014-11-11 MED ORDER — MIDAZOLAM HCL 2 MG/2ML IJ SOLN: 0.5000 mg | Freq: Once | INTRAMUSCULAR | Status: DC | PRN

## 2014-11-11 MED ORDER — HYDROMORPHONE HCL 1 MG/ML IJ SOLN
0.2500 mg | INTRAMUSCULAR | Status: DC | PRN
  Administered 2014-11-11: 0.5 mg via INTRAVENOUS

## 2014-11-11 MED ORDER — DIPHENHYDRAMINE HCL 12.5 MG/5ML PO ELIX: 12.5000 mg | ORAL_SOLUTION | ORAL | Status: DC | PRN

## 2014-11-11 MED ORDER — PHENYLEPHRINE HCL 10 MG/ML IJ SOLN
INTRAMUSCULAR | Status: DC | PRN
  Administered 2014-11-11: 40 ug via INTRAVENOUS
  Administered 2014-11-11 (×2): 80 ug via INTRAVENOUS
  Administered 2014-11-11 (×2): 40 ug via INTRAVENOUS
  Administered 2014-11-11 (×2): 80 ug via INTRAVENOUS
  Administered 2014-11-11 (×2): 40 ug via INTRAVENOUS
  Administered 2014-11-11: 80 ug via INTRAVENOUS
  Administered 2014-11-11: 40 ug via INTRAVENOUS
  Administered 2014-11-11: 80 ug via INTRAVENOUS

## 2014-11-11 MED ORDER — LACTATED RINGERS IV SOLN
INTRAVENOUS | Status: DC | PRN
  Administered 2014-11-11 (×3): via INTRAVENOUS

## 2014-11-11 MED ORDER — METHOCARBAMOL 1000 MG/10ML IJ SOLN
500.0000 mg | Freq: Four times a day (QID) | INTRAVENOUS | Status: DC | PRN
  Filled 2014-11-11: qty 5

## 2014-11-11 MED ORDER — PHENYLEPHRINE HCL 10 MG/ML IJ SOLN
INTRAMUSCULAR | Status: AC
  Filled 2014-11-11: qty 3

## 2014-11-11 MED ORDER — SUCCINYLCHOLINE CHLORIDE 20 MG/ML IJ SOLN
INTRAMUSCULAR | Status: DC | PRN
  Administered 2014-11-11: 100 mg via INTRAVENOUS

## 2014-11-11 MED ORDER — ONDANSETRON HCL 4 MG PO TABS: 4.0000 mg | ORAL_TABLET | Freq: Three times a day (TID) | ORAL | Status: DC | PRN

## 2014-11-11 SURGICAL SUPPLY — 61 items
BIT DRILL 5/64X5 DISP (BIT) ×3 IMPLANT
BLADE SAW SAG 73X25 THK (BLADE) ×2
BLADE SAW SGTL 73X25 THK (BLADE) ×1 IMPLANT
BRUSH FEMORAL CANAL (MISCELLANEOUS) IMPLANT
CAPT HIP TOTAL 2 ×3 IMPLANT
CLOSURE STERI-STRIP 1/2X4 (GAUZE/BANDAGES/DRESSINGS) ×1
CLSR STERI-STRIP ANTIMIC 1/2X4 (GAUZE/BANDAGES/DRESSINGS) ×2 IMPLANT
COVER SURGICAL LIGHT HANDLE (MISCELLANEOUS) ×3 IMPLANT
DRAPE IMP U-DRAPE 54X76 (DRAPES) ×3 IMPLANT
DRAPE INCISE IOBAN 66X45 STRL (DRAPES) IMPLANT
DRAPE ORTHO SPLIT 77X108 STRL (DRAPES) ×4
DRAPE PROXIMA HALF (DRAPES) ×6 IMPLANT
DRAPE SURG ORHT 6 SPLT 77X108 (DRAPES) ×2 IMPLANT
DRAPE U-SHAPE 47X51 STRL (DRAPES) ×3 IMPLANT
DRSG MEPILEX BORDER 4X12 (GAUZE/BANDAGES/DRESSINGS) IMPLANT
DRSG MEPILEX BORDER 4X8 (GAUZE/BANDAGES/DRESSINGS) ×3 IMPLANT
DRSG PAD ABDOMINAL 8X10 ST (GAUZE/BANDAGES/DRESSINGS) IMPLANT
DURAPREP 26ML APPLICATOR (WOUND CARE) ×3 IMPLANT
ELECT CAUTERY BLADE 6.4 (BLADE) ×3 IMPLANT
ELECT REM PT RETURN 9FT ADLT (ELECTROSURGICAL) ×3
ELECTRODE REM PT RTRN 9FT ADLT (ELECTROSURGICAL) ×1 IMPLANT
GLOVE BIOGEL PI IND STRL 8 (GLOVE) ×1 IMPLANT
GLOVE BIOGEL PI INDICATOR 8 (GLOVE) ×2
GLOVE BIOGEL PI ORTHO PRO SZ8 (GLOVE) ×2
GLOVE ORTHO TXT STRL SZ7.5 (GLOVE) ×3 IMPLANT
GLOVE PI ORTHO PRO STRL SZ8 (GLOVE) ×1 IMPLANT
GLOVE SURG ORTHO 8.0 STRL STRW (GLOVE) ×3 IMPLANT
GOWN STRL REUS W/ TWL XL LVL3 (GOWN DISPOSABLE) ×1 IMPLANT
GOWN STRL REUS W/TWL 2XL LVL3 (GOWN DISPOSABLE) ×3 IMPLANT
GOWN STRL REUS W/TWL XL LVL3 (GOWN DISPOSABLE) ×2
HANDPIECE INTERPULSE COAX TIP (DISPOSABLE)
HOOD PEEL AWAY FACE SHEILD DIS (HOOD) ×6 IMPLANT
KIT BASIN OR (CUSTOM PROCEDURE TRAY) ×3 IMPLANT
KIT ROOM TURNOVER OR (KITS) ×3 IMPLANT
MANIFOLD NEPTUNE II (INSTRUMENTS) ×3 IMPLANT
NDL SUT .5 MAYO 1.404X.05X (NEEDLE) ×1 IMPLANT
NEEDLE HYPO 25GX1X1/2 BEV (NEEDLE) ×3 IMPLANT
NEEDLE MAYO TAPER (NEEDLE) ×2
NS IRRIG 1000ML POUR BTL (IV SOLUTION) ×3 IMPLANT
PACK TOTAL JOINT (CUSTOM PROCEDURE TRAY) ×3 IMPLANT
PAD ARMBOARD 7.5X6 YLW CONV (MISCELLANEOUS) ×6 IMPLANT
PILLOW ABDUCTION HIP (SOFTGOODS) ×3 IMPLANT
PRESSURIZER FEMORAL UNIV (MISCELLANEOUS) IMPLANT
RETRIEVER SUT HEWSON (MISCELLANEOUS) ×3 IMPLANT
SET HNDPC FAN SPRY TIP SCT (DISPOSABLE) IMPLANT
SPONGE LAP 4X18 X RAY DECT (DISPOSABLE) IMPLANT
SUCTION FRAZIER TIP 10 FR DISP (SUCTIONS) ×3 IMPLANT
SUT FIBERWIRE #2 38 REV NDL BL (SUTURE) ×9
SUT MNCRL AB 4-0 PS2 18 (SUTURE) ×3 IMPLANT
SUT VIC AB 0 CT1 27 (SUTURE) ×2
SUT VIC AB 0 CT1 27XBRD ANBCTR (SUTURE) ×1 IMPLANT
SUT VIC AB 2-0 CT1 27 (SUTURE) ×2
SUT VIC AB 2-0 CT1 TAPERPNT 27 (SUTURE) ×1 IMPLANT
SUT VIC AB 3-0 SH 8-18 (SUTURE) ×3 IMPLANT
SUTURE FIBERWR#2 38 REV NDL BL (SUTURE) ×3 IMPLANT
SYR CONTROL 10ML LL (SYRINGE) ×3 IMPLANT
TOWEL OR 17X24 6PK STRL BLUE (TOWEL DISPOSABLE) ×3 IMPLANT
TOWEL OR 17X26 10 PK STRL BLUE (TOWEL DISPOSABLE) ×3 IMPLANT
TOWER CARTRIDGE SMART MIX (DISPOSABLE) IMPLANT
TRAY FOLEY CATH 14FR (SET/KITS/TRAYS/PACK) IMPLANT
WATER STERILE IRR 1000ML POUR (IV SOLUTION) ×6 IMPLANT

## 2014-11-11 NOTE — Anesthesia Procedure Notes (Signed)
Procedure Name: Intubation Date/Time: 11/11/2014 7:41 AM Performed by: Vennie Homans Pre-anesthesia Checklist: Patient identified, Timeout performed, Emergency Drugs available, Suction available and Patient being monitored Patient Re-evaluated:Patient Re-evaluated prior to inductionOxygen Delivery Method: Circle system utilized Preoxygenation: Pre-oxygenation with 100% oxygen Intubation Type: IV induction Ventilation: Mask ventilation without difficulty Laryngoscope Size: Mac and 3 Grade View: Grade I Tube type: Oral Tube size: 7.5 mm Number of attempts: 1 Airway Equipment and Method: Stylet Placement Confirmation: ETT inserted through vocal cords under direct vision,  positive ETCO2 and breath sounds checked- equal and bilateral Secured at: 21 cm Tube secured with: Tape Dental Injury: Teeth and Oropharynx as per pre-operative assessment

## 2014-11-11 NOTE — H&P (Signed)
PREOPERATIVE H&P  Chief Complaint: OA RIGHT HIP  HPI: [name] is a 69 y.o. female who presents for preoperative history and physical with a diagnosis of right hip oa. Symptoms are rated as moderate to severe, and have been worsening.  This is significantly impairing activities of daily living.  She has elected for surgical management.   She has failed injections, activity modification, anti-inflammatories, and assistive devices.  Preoperative X-rays demonstrate end stage degenerative changes with osteophyte formation, loss of joint space, subchondral sclerosis.   Past Medical History  Diagnosis Date  . Hyperlipidemia   . Shortness of breath     with exertion  . Hypothyroidism   . Arthritis   . GERD (gastroesophageal reflux disease)   . IBS (irritable bowel syndrome)   . Heart murmur     MILD, NO CARDIOLOGIST  . Pneumonia   . Depression   . Diverticulitis   . Umbilical hernia    Past Surgical History  Procedure Laterality Date  . Cesarean section  1967  . Tubal ligation  1970's  . Colonoscopy  2011    Dr. Arnoldo Morale: sigmoid diverticulosis, repeat Aug 2016 due to history of adenomatous polyps  . Esophagogastroduodenoscopy  2011    Dr. Arnoldo Morale: multiple gastric polyps, path with mild chronic gastritis  . Cholecystectomy  01/10/2011    Procedure: LAPAROSCOPIC CHOLECYSTECTOMY;  Surgeon: Scherry Ran;  Location: [facility];  Service: General;  Laterality: N/A;  . Cataract extraction w/phaco Left 05/03/2012    Procedure: CATARACT EXTRACTION PHACO AND INTRAOCULAR LENS PLACEMENT (IOC);  Surgeon: Tonny Branch, MD;  Location: [facility];  Service: Ophthalmology;  Laterality: Left;  CDE: 16.80  . Cataract extraction w/phaco Right 05/21/2012    Procedure: CATARACT EXTRACTION PHACO AND INTRAOCULAR LENS PLACEMENT (IOC);  Surgeon: Tonny Branch, MD;  Location: [facility];  Service: Ophthalmology;  Laterality: Right;  CDE=12.31  . Colonoscopy N/A 02/04/2013    Dr. Gala Romney :Colonic diverticulosis,  repeat in 2019  . Esophagogastroduodenoscopy N/A 02/04/2013    Dr. Rourk:Small hiatal hernia. Gastric polyps s/p bx, negative H.pylori. Benign path.   . Abdominal hysterectomy  2003    OVARIES REMOVED  . Eus N/A 08/29/2013    Dr. Ardis Hughs: no clear cause for acute pancreatitis. Autoimmune labs normal. Question HCTZ as culprit   Social History   Social History  . Marital Status: Married    Spouse Name: N/A  . Number of Children: N/A  . Years of Education: N/A   Social History Main Topics  . Smoking status: Never Smoker   . Smokeless tobacco: Never Used  . Alcohol Use: No  . Drug Use: No  . Sexual Activity: Yes    Birth Control/ Protection: None   Other Topics Concern  . None   Social History Narrative   Family History  Problem Relation Age of Onset  . Anesthesia problems Neg Hx   . Hypotension Neg Hx   . Malignant hyperthermia Neg Hx   . Pseudochol deficiency Neg Hx   . Colon cancer Neg Hx   . Pancreatitis Neg Hx    Allergies  Allergen Reactions  . Metoclopramide Hcl Other (See Comments)    "Chemical Imbalance" per pt  . Aspirin     Intolerance because of acid reflux   Prior to Admission medications   Medication Sig Start Date End Date Taking? Authorizing Provider  acetaminophen (TYLENOL) 650 MG CR tablet Take 1,300 mg by mouth every 8 (eight) hours as needed for pain.   Yes Historical Provider,  MD  levothyroxine (SYNTHROID, LEVOTHROID) 88 MCG tablet Take 88 mcg by mouth daily before breakfast.    Yes Historical Provider, MD  lubiprostone (AMITIZA) 24 MCG capsule Take 1 capsule (24 mcg total) by mouth daily with breakfast. Patient taking differently: Take 24 mcg by mouth daily as needed for constipation.  03/06/14  Yes Orvil Feil, NP  pantoprazole (PROTONIX) 40 MG tablet Take 1 tablet (40 mg total) by mouth daily. 04/09/14  Yes Orvil Feil, NP  polyethylene glycol powder (GLYCOLAX/MIRALAX) powder Take 17 g by mouth at bedtime.   Yes Historical Provider, MD  propranolol  (INDERAL) 80 MG tablet Take 80 mg by mouth every evening.    Yes Historical Provider, MD     Positive ROS: All other systems have been reviewed and were otherwise negative with the exception of those mentioned in the HPI and as above.  Physical Exam: General: Alert, no acute distress Cardiovascular: No pedal edema Respiratory: No cyanosis, no use of accessory musculature GI: No organomegaly, abdomen is soft and non-tender Skin: No lesions in the area of chief complaint Neurologic: Sensation intact distally Psychiatric: Patient is competent for consent with normal mood and affect Lymphatic: No axillary or cervical lymphadenopathy  MUSCULOSKELETAL: right hip with 0-80, painful arc, 10 ER, 0 IR  Assessment: Right hip osteoarthritis primary localized   Plan: Plan for Procedure(s): TOTAL RIGHT HIP ARTHROPLASTY  The risks benefits and alternatives were discussed with the patient including but not limited to the risks of nonoperative treatment, versus surgical intervention including infection, bleeding, nerve injury, periprosthetic fracture, the need for revision surgery, dislocation, leg length discrepancy, blood clots, cardiopulmonary complications, morbidity, mortality, among others, and they were willing to proceed.     Johnny Bridge, MD Cell [phone]   11/11/2014 7:23 AM

## 2014-11-11 NOTE — Evaluation (Signed)
Physical Therapy Evaluation Patient Details Name: [name] MRN: [id number] DOB: [birth year] Today's Date: 11/11/2014   History of Present Illness  Patient is a 69 y/o female s/p R THA, posterior approach. PMH includes HTN, PNA, depression.  Clinical Impression  Patient presents with pain and post surgical deficits RLE s/p R THA impacting mobility. Tolerated SPT and short distance ambulation with Min A for balance/safety. Pt eager and motivated to return home with help from spouse however needs to be Supervision level for all mobility. Will monitor daily for progress and any changes in disposition. Reviewed posterior hip precautions. Will follow acutely to maximize independence and mobility prior to return home.     Follow Up Recommendations Home health PT;Supervision/Assistance - 24 hour    Equipment Recommendations  None recommended by PT    Recommendations for Other Services OT consult     Precautions / Restrictions Precautions Precautions: Posterior Hip;Fall Precaution Booklet Issued: No Precaution Comments: Reviewed precautions. Required Braces or Orthoses: Other Brace/Splint Other Brace/Splint: hip abduction wedge Restrictions Weight Bearing Restrictions: Yes RLE Weight Bearing: Weight bearing as tolerated      Mobility  Bed Mobility Overal bed mobility: Needs Assistance Bed Mobility: Supine to Sit     Supine to sit: Min assist;HOB elevated     General bed mobility comments: Min A to bring RLE to EOB. Increased time. Cues for technique.  Transfers Overall transfer level: Needs assistance Equipment used: Rolling walker (2 wheeled) Transfers: Sit to/from [facility] Sit to Stand: Min assist Stand pivot transfers: Min assist       General transfer comment: Min A to boost from EOB with cues for hand placement/technique. SPT bed to [facility] Min A for balance. Transferred to chair post ambulation bout.  Ambulation/Gait Ambulation/Gait  assistance: Min assist Ambulation Distance (Feet): 4 Feet Assistive device: Rolling walker (2 wheeled) Gait Pattern/deviations: Step-to pattern;Decreased stance time - right;Decreased step length - left;Trunk flexed;Shuffle   Gait velocity interpretation: <1.8 ft/sec, indicative of risk for recurrent falls General Gait Details: Min A forbalance and for RW management. Decreased stance time RLE.   Stairs            Wheelchair Mobility    Modified Rankin (Stroke Patients Only)       Balance Overall balance assessment: Needs assistance Sitting-balance support: Feet supported;Single extremity supported Sitting balance-Leahy Scale: Fair     Standing balance support: During functional activity Standing balance-Leahy Scale: Poor Standing balance comment: Relient on RW for support. Min A for balance for pericare                             Pertinent Vitals/Pain Pain Assessment: Faces Faces Pain Scale: Hurts even more Pain Location: right hip with movement Pain Descriptors / Indicators: Sore;Aching Pain Intervention(s): Monitored during session;Repositioned;Ice applied    Home Living Family/patient expects to be discharged to:: Private residence Living Arrangements: Spouse/significant other;Other relatives (62 y/o grandson) Available Help at Discharge: Family;Available 24 hours/day Type of Home: House Home Access: Ramped entrance     Home Layout: One level Home Equipment: Walker - 2 wheels;Bedside commode;Other (comment);Cane - single point (lift chair)      Prior Function Level of Independence: Needs assistance   Gait / Transfers Assistance Needed: Pt using SPC for ambulation.  ADL's / Homemaking Assistance Needed: Reports needing help with donning shoes/socks from spouse.   Comments: Does not drive.      Hand Dominance  Extremity/Trunk Assessment   Upper Extremity Assessment: Defer to OT evaluation           Lower Extremity  Assessment: RLE deficits/detail RLE Deficits / Details: Limited AROM/strength secondary to pain/recent surgery.       Communication   Communication: No difficulties  Cognition Arousal/Alertness: Awake/alert Behavior During Therapy: WFL for tasks assessed/performed Overall Cognitive Status: Within Functional Limits for tasks assessed                      General Comments General comments (skin integrity, edema, etc.): Family stepped out of room for PT evaluation.    Exercises Total Joint Exercises Ankle Circles/Pumps: Both;10 reps;Seated Quad Sets: Both;10 reps;Seated Gluteal Sets: Both;10 reps;Seated      Assessment/Plan    PT Assessment Patient needs continued PT services  PT Diagnosis Difficulty walking;Acute pain;Generalized weakness   PT Problem List Decreased strength;Pain;Decreased range of motion;Decreased activity tolerance;Decreased balance;Decreased mobility;Decreased knowledge of precautions  PT Treatment Interventions Balance training;Gait training;Therapeutic activities;Therapeutic exercise;Functional mobility training;Patient/family education;DME instruction   PT Goals (Current goals can be found in the Care Plan section) Acute Rehab PT Goals Patient Stated Goal: to be able to go home PT Goal Formulation: With patient Time For Goal Achievement: 11/25/14 Potential to Achieve Goals: Fair    Frequency 7X/week   Barriers to discharge Decreased caregiver support Not sure how much her spouse can do.    Co-evaluation               End of Session Equipment Utilized During Treatment: Gait belt Activity Tolerance: Patient tolerated treatment well Patient left: in chair;with call bell/phone within reach;with family/visitor present;Other (comment) (hip abduction wedge) Nurse Communication: Mobility status         Time: 7841-2820 PT Time Calculation (min) (ACUTE ONLY): 26 min   Charges:   PT Evaluation $Initial PT Evaluation Tier I: 1  Procedure PT Treatments $Therapeutic Activity: 8-22 mins   PT G Codes:        Brittinie Wherley A Myrth Dahan 11/11/2014, 4:49 PM Wray Kearns, [place], DPT [phone]

## 2014-11-11 NOTE — Progress Notes (Signed)
Utilization review completed.  

## 2014-11-11 NOTE — Anesthesia Postprocedure Evaluation (Signed)
  Anesthesia Post-op Note  Patient: [name]  Procedure(s) Performed: Procedure(s): TOTAL RIGHT HIP ARTHROPLASTY (Right)  Patient Location: PACU  Anesthesia Type:General  Level of Consciousness: awake, alert , oriented and patient cooperative  Airway and Oxygen Therapy: Patient Spontanous Breathing and Patient connected to nasal cannula oxygen  Post-op Pain: mild  Post-op Assessment: Post-op Vital signs reviewed, Patient's Cardiovascular Status Stable, Respiratory Function Stable, Patent Airway, No signs of Nausea or vomiting and Pain level controlled              Post-op Vital Signs: Reviewed and stable  Last Vitals:  Filed Vitals:   11/11/14 1500  BP: 117/57  Pulse: 50  Temp: 36.6 C  Resp: 14    Complications: No apparent anesthesia complications

## 2014-11-11 NOTE — Transfer of Care (Signed)
Immediate Anesthesia Transfer of Care Note  Patient: [name]  Procedure(s) Performed: Procedure(s): TOTAL RIGHT HIP ARTHROPLASTY (Right)  Patient Location: PACU  Anesthesia Type:General  Level of Consciousness: awake, alert , oriented, patient cooperative and responds to stimulation  Airway & Oxygen Therapy: Patient Spontanous Breathing and Patient connected to face mask oxygen  Post-op Assessment: Report given to RN, Post -op Vital signs reviewed and stable and Patient able to stick tongue midline  Post vital signs: Reviewed and stable  Last Vitals:  Filed Vitals:   11/11/14 0653  BP: 151/60  Pulse: 89  Temp: 36.3 C  Resp: 18    Complications: No apparent anesthesia complications

## 2014-11-11 NOTE — Anesthesia Preprocedure Evaluation (Addendum)
Anesthesia Evaluation  Patient identified by MRN, date of birth, ID band Patient awake    Reviewed: Allergy & Precautions, NPO status , Patient's Chart, lab work & pertinent test results  Airway Mallampati: I  TM Distance: >3 FB Neck ROM: Full    Dental  (+) Chipped, Dental Advisory Given   Pulmonary neg pulmonary ROS,    breath sounds clear to auscultation       Cardiovascular  Rhythm:Regular Rate:Normal  '15 ECHO: EF 60-65%, trace MR   Neuro/Psych Anxiety Depression Tremors: propranolol    GI/Hepatic Neg liver ROS, GERD  Medicated and Controlled,  Endo/Other  Hypothyroidism   Renal/GU negative Renal ROS     Musculoskeletal  (+) Arthritis , Osteoarthritis,    Abdominal   Peds  Hematology negative hematology ROS (+)   Anesthesia Other Findings   Reproductive/Obstetrics                            Anesthesia Physical Anesthesia Plan  ASA: II  Anesthesia Plan: General   Post-op Pain Management:    Induction: Intravenous  Airway Management Planned: Oral ETT  Additional Equipment:   Intra-op Plan:   Post-operative Plan: Extubation in OR  Informed Consent: I have reviewed the patients History and Physical, chart, labs and discussed the procedure including the risks, benefits and alternatives for the proposed anesthesia with the patient or authorized representative who has indicated his/her understanding and acceptance.   Dental advisory given  Plan Discussed with: CRNA and Surgeon  Anesthesia Plan Comments: (Pt declines SAB, plan routine monitors, GETA)        Anesthesia Quick Evaluation

## 2014-11-11 NOTE — Op Note (Signed)
11/11/2014  9:27 AM  PATIENT:  [name]   MRN: [id number]  PRE-OPERATIVE DIAGNOSIS:  OA RIGHT HIP  POST-OPERATIVE DIAGNOSIS:  OA RIGHT HIP  PROCEDURE:  Procedure(s): TOTAL RIGHT HIP ARTHROPLASTY  PREOPERATIVE INDICATIONS:    [name] is an 69 y.o. female who has a diagnosis of Primary localized osteoarthritis of right hip and elected for surgical management after failing conservative treatment.  The risks benefits and alternatives were discussed with the patient including but not limited to the risks of nonoperative treatment, versus surgical intervention including infection, bleeding, nerve injury, periprosthetic fracture, the need for revision surgery, dislocation, leg length discrepancy, blood clots, cardiopulmonary complications, morbidity, mortality, among others, and they were willing to proceed.     OPERATIVE REPORT     SURGEON:  Marchia Bond, MD    ASSISTANT:  Joya Gaskins, OPA-C  (Present throughout the entire procedure,  necessary for completion of procedure in a timely manner, assisting with retraction, instrumentation, and closure)     ANESTHESIA:  General    COMPLICATIONS:  None.     COMPONENTS:  [employer] fit femur size 5 hi-offset with a 36 mm +5 head ball and a gription acetabular shell size 52 with a neutral plus 4 polyethylene liner    UNIQUE ASPECTS OF THE CASE:  She had significant loss of external rotation which made prepping the leg somewhat challenging. She had a fair amount of medial bone on the acetabulum, and after placement of the acetabular cup, and trialing of the hip, despite having a reasonably long neck that was about a thumb's breadth from the lesser trochanter, she felt short, and was somewhat unstable with the +1.5 with a 4 femur. I ended up putting a 5 somewhat proud, in order to restore length, and in fact needed a high offset as well, it may have been that with medialization of the cup, and her native varus anatomy  that the high offset was necessary. I had excellent capsular closure indicating that she did in fact need the high offset. The hip was extremely stable throughout a functional range of motion after all of the implants were in.  PROCEDURE IN DETAIL:   The patient was met in the holding area and  identified.  The appropriate hip was identified and marked at the operative site.  The patient was then transported to the OR  and  placed under general anesthesia.  At that point, the patient was  placed in the lateral decubitus position with the operative side up and  secured to the operating room table and all bony prominences padded.     The operative lower extremity was prepped from the iliac crest to the distal leg.  Sterile draping was performed.  Time out was performed prior to incision.      A routine posterolateral approach was utilized via sharp dissection  carried down to the subcutaneous tissue.  Gross bleeders were Bovie coagulated.  The iliotibial band was identified and incised along the length of the skin incision.  Self-retaining retractors were  inserted.  With the hip internally rotated, the short external rotators  were identified. The piriformis and capsule was tagged with FiberWire, and the hip capsule released in a T-type fashion.  The femoral neck was exposed, and I resected the femoral neck using the appropriate jig. This was performed at approximately a thumb's breadth above the lesser trochanter.    I then exposed the deep acetabulum, cleared out any tissue including the ligamentum  teres.  A wing retractor was placed.  After adequate visualization, I excised the labrum, and then sequentially reamed.  I placed the trial acetabulum, which seated nicely, and then impacted the real cup into place.  Appropriate version and inclination was confirmed clinically matching their bony anatomy, and also with the use of the jig.  A trial polyethylene liner was placed and the wing retractor  removed.    I then prepared the proximal femur using the cookie-cutter, the lateralizing reamer, and then sequentially reamed and broached.  A trial broach, neck, and head was utilized, and I reduced the hip and it was found to have excellent stability with functional range of motion. The trial components were then removed, and the real polyethylene liner was placed with the lip directed posteriorly.  I then impacted the real femoral prosthesis into place into the appropriate version, slightly anteverted to the normal anatomy, and I impacted the real head ball into place. The hip was then reduced and taken through functional range of motion and found to have excellent stability. Leg lengths were restored.  I then used a 2 mm drill bits to pass the FiberWire suture from the capsule and piriformis through the greater trochanter, and secured this. Excellent posterior capsular repair was achieved. I also closed the T in the capsule.  I then irrigated the hip copiously again with pulse lavage, and repaired the fascia with Vicryl, followed by Vicryl for the subcutaneous tissue, Monocryl for the skin, Steri-Strips and sterile gauze. The wounds were injected. The patient was then awakened and returned to PACU in stable and satisfactory condition. There were no complications.  Marchia Bond, MD Orthopedic Surgeon [phone]   11/11/2014 9:27 AM

## 2014-11-12 ENCOUNTER — Encounter (HOSPITAL_COMMUNITY): Payer: Self-pay | Admitting: Orthopedic Surgery

## 2014-11-12 LAB — CBC
HEMATOCRIT: 33.5 % — AB (ref 36.0–46.0)
HEMOGLOBIN: 11.1 g/dL — AB (ref 12.0–15.0)
MCH: 29.1 pg (ref 26.0–34.0)
MCHC: 33.1 g/dL (ref 30.0–36.0)
MCV: 87.7 fL (ref 78.0–100.0)
Platelets: 158 10*3/uL (ref 150–400)
RBC: 3.82 MIL/uL — AB (ref 3.87–5.11)
RDW: 12.6 % (ref 11.5–15.5)
WBC: 11.7 10*3/uL — AB (ref 4.0–10.5)

## 2014-11-12 LAB — BASIC METABOLIC PANEL
ANION GAP: 8 (ref 5–15)
BUN: 11 mg/dL (ref 6–20)
CHLORIDE: 101 mmol/L (ref 101–111)
CO2: 26 mmol/L (ref 22–32)
Calcium: 8.4 mg/dL — ABNORMAL LOW (ref 8.9–10.3)
Creatinine, Ser: 0.89 mg/dL (ref 0.44–1.00)
GFR calc Af Amer: >60 mL/min (ref >60)
GLUCOSE: 205 mg/dL — AB (ref 65–99)
POTASSIUM: 4.2 mmol/L (ref 3.5–5.1)
Sodium: 135 mmol/L (ref 135–145)

## 2014-11-12 NOTE — Evaluation (Signed)
Occupational Therapy Evaluation Patient Details Name: [name] MRN: [id number] DOB: Oct 25, 1945 Today's Date: 11/12/2014    History of Present Illness Patient is a 69 y/o female s/p R THA, posterior approach. PMH includes HTN, PNA, depression.   Clinical Impression   Pt admitted with above diagnosis and presenting with deficits listed below. Pt reports that her husband was assisting with LB ADLs PTA. Discussed use of AE for increased independence with LB ADLs, pt refused and stated that she prefers her husband continue to assist until she is able to complete independently. Pt was able to recall 3/3 hip precautions and adhere to precautions during functional activity and mobility with min verbal cues x 1. Pt planning to d/c home with family to assist 24/7. At this time not recommending further skilled OT services.     Follow Up Recommendations  No OT follow up;Supervision/Assistance - 24 hour    Equipment Recommendations  3 in 1 bedside comode    Recommendations for Other Services       Precautions / Restrictions Precautions Precautions: Posterior Hip;Fall Precaution Booklet Issued: Yes (comment) Precaution Comments: Reviewed precautions. (Pt able to recall 3/3 posterior hip precautions) Required Braces or Orthoses: Other Brace/Splint Other Brace/Splint: hip abduction wedge Restrictions Weight Bearing Restrictions: Yes RLE Weight Bearing: Weight bearing as tolerated      Mobility Bed Mobility               General bed mobility comments: Pt found in recliner, returned to recliner at end of session  Transfers Overall transfer level: Needs assistance Equipment used: Rolling walker (2 wheeled) Transfers: Sit to/from Stand Sit to Stand: Min guard         General transfer comment: cues for hand placement, scooting to edge of chair    Balance Overall balance assessment: Needs assistance         Standing balance support: Single extremity  supported Standing balance-Leahy Scale: Poor Standing balance comment: RW for UE support                            ADL Overall ADL's : Needs assistance/impaired Eating/Feeding: Set up;Sitting   Grooming: Wash/dry hands;Standing;Min guard   Upper Body Bathing: Supervision/ safety;Sitting   Lower Body Bathing: Minimal assistance;Sit to/from stand Lower Body Bathing Details (indicate cue type and reason): Min assit to wash feet Upper Body Dressing : Supervision/safety;Sitting   Lower Body Dressing: Minimal assistance;Sit to/from stand Lower Body Dressing Details (indicate cue type and reason): Min assist for socks/shoes Toilet Transfer: Min guard;Ambulation;BSC;RW (BSC over toilet)   Toileting- Clothing Manipulation and Hygiene: Min guard;Sit to/from stand;Cueing for safety Toileting - Clothing Manipulation Details (indicate cue type and reason): Verbal cues to adhere to hip precautions Tub/ Shower Transfer: Walk-in shower;Min guard;Ambulation;Shower Technical sales engineer Details (indicate cue type and reason): Discussed walk in shower transfer technique Functional mobility during ADLs: Min guard;Rolling walker General ADL Comments: Discussed use of AE for LB ADLs to increase independence with LB ADLs. Pt reports that her husband has been helping her with LB ADLs for so long and she would rather him continue to help rather than get AE     Vision     Perception     Praxis      Pertinent Vitals/Pain Pain Assessment: 0-10 Pain Score: 2  Pain Location: R hip  Pain Descriptors / Indicators: Aching;Sore Pain Intervention(s): Monitored during session     Hand Dominance Right   Extremity/Trunk  Assessment Upper Extremity Assessment Upper Extremity Assessment: Overall WFL for tasks assessed   Lower Extremity Assessment Lower Extremity Assessment: Defer to PT evaluation   Cervical / Trunk Assessment Cervical / Trunk Assessment: Normal    Communication Communication Communication: No difficulties   Cognition Arousal/Alertness: Awake/alert Behavior During Therapy: WFL for tasks assessed/performed Overall Cognitive Status: Within Functional Limits for tasks assessed                     General Comments       Exercises       Shoulder Instructions      Home Living Family/patient expects to be discharged to:: Private residence Living Arrangements: Spouse/significant other;Other relatives Available Help at Discharge: Family;Available 24 hours/day Type of Home: House Home Access: Ramped entrance     Home Layout: One level     Bathroom Shower/Tub: Occupational psychologist: Standard Bathroom Accessibility: Yes How Accessible: Accessible via walker Home Equipment: [place] - 2 wheels;Other (comment);Cane - single point;Shower seat   Additional Comments: Per pt report in OT eval, pt does not have a 3 in 1 currently at home       Prior Functioning/Environment Level of Independence: Needs assistance    ADL's / Homemaking Assistance Needed: Reports needing help with donning shoes/socks from spouse. Also needed help with washing her feet PTA    Comments: Does not drive.     OT Diagnosis: Generalized weakness;Acute pain   OT Problem List:     OT Treatment/Interventions:      OT Goals(Current goals can be found in the care plan section) Acute Rehab OT Goals Patient Stated Goal: to be able to go home  OT Frequency:     Barriers to D/C:            Co-evaluation              End of Session Equipment Utilized During Treatment: Gait belt;Rolling walker Nurse Communication: Other (comment) (Pt IV needed to be reset )  Activity Tolerance: Patient tolerated treatment well Patient left: in chair;with call bell/phone within reach;with nursing/sitter in room   Time: 5449-2010 OT Time Calculation (min): 19 min Charges:  OT General Charges $OT Visit: 1 Procedure OT Evaluation $Initial  OT Evaluation Tier I: 1 Procedure G-Codes:    Binnie Kand M.S., OTR/L  Pager: [phone]  11/12/2014, 12:23 PM

## 2014-11-12 NOTE — Progress Notes (Signed)
Physical Therapy Treatment Patient Details Name: [name] MRN: [id number] DOB: 1946/01/19 Today's Date: 11/12/2014    History of Present Illness Patient is a 69 y/o female s/p R THA, posterior approach. PMH includes HTN, PNA, depression.    PT Comments    Progressed with ambulation distance again this session.  Reviewed exercises she can do independently.  Needed cues for turns with walker for safety/hip precautions.  Continue acute level skilled PT and educate family as able for mobility needs.  Follow Up Recommendations  Home health PT;Supervision/Assistance - 24 hour     Equipment Recommendations  None recommended by PT    Recommendations for Other Services       Precautions / Restrictions Precautions Precautions: Posterior Hip;Fall Precaution Comments: Reviewed precautions. Required Braces or Orthoses: Other Brace/Splint Other Brace/Splint: hip abduction wedge Restrictions Weight Bearing Restrictions: Yes RLE Weight Bearing: Weight bearing as tolerated    Mobility  Bed Mobility Overal bed mobility: Needs Assistance Bed Mobility: Supine to Sit;Sit to Supine     Supine to sit: Min assist;HOB elevated Sit to supine: Min assist   General bed mobility comments: assist for right LE and cues for technique, to supine assist for right LE  Transfers Overall transfer level: Needs assistance Equipment used: Rolling walker (2 wheeled) Transfers: Sit to/from Stand Sit to Stand: Min guard;Supervision         General transfer comment: assist for safety  Ambulation/Gait Ambulation/Gait assistance: Supervision;Min guard Ambulation Distance (Feet): 160 Feet Assistive device: Rolling walker (2 wheeled) Gait Pattern/deviations: Step-through pattern;Decreased stride length;Decreased stance time - right;Decreased step length - left;Antalgic     General Gait Details: min assist for safety, cue for step length   Stairs            Wheelchair Mobility     Modified Rankin (Stroke Patients Only)       Balance Overall balance assessment: Needs assistance         Standing balance support: Bilateral upper extremity supported Standing balance-Leahy Scale: Poor Standing balance comment: RW for UE support                    Cognition Arousal/Alertness: Awake/alert Behavior During Therapy: WFL for tasks assessed/performed Overall Cognitive Status: Within Functional Limits for tasks assessed                      Exercises Total Joint Exercises Ankle Circles/Pumps: Both;5 reps;Supine;AROM Quad Sets: AROM;Right;5 reps;Supine    General Comments General comments (skin integrity, edema, etc.): No family present for OT eval.       Pertinent Vitals/Pain Pain Assessment: 0-10 Pain Score: 8  Pain Location: R hip Pain Descriptors / Indicators: Aching;Sore Pain Intervention(s): Repositioned;Patient requesting pain meds-RN notified    Home Living Family/patient expects to be discharged to:: Private residence Living Arrangements: Spouse/significant other;Other relatives Available Help at Discharge: Family;Available 24 hours/day Type of Home: House Home Access: Ramped entrance   Home Layout: One level Home Equipment: Walker - 2 wheels;Other (comment);Cane - single point;Shower seat Additional Comments: Per pt report in OT eval, pt does not have a 3 in 1 currently at home     Prior Function Level of Independence: Needs assistance    ADL's / Homemaking Assistance Needed: Reports needing help with donning shoes/socks from spouse. Also needed help with washing her feet PTA  Comments: Does not drive.    PT Goals (current goals can now be found in the care plan section) Acute Rehab PT  Goals Patient Stated Goal: to be able to go home Progress towards PT goals: Progressing toward goals    Frequency  7X/week    PT Plan Current plan remains appropriate    Co-evaluation             End of Session Equipment  Utilized During Treatment: Gait belt Activity Tolerance: Patient tolerated treatment well Patient left: in bed;with call bell/phone within reach     Time: 1514-1533 PT Time Calculation (min) (ACUTE ONLY): 19 min  Charges:  $Gait Training: 8-22 mins                    G Codes:      WYNN,CYNDI 2014/12/06, 3:40 PM  [name], [place] December 06, 2014

## 2014-11-12 NOTE — Discharge Instructions (Signed)
INSTRUCTIONS AFTER JOINT REPLACEMENT  ° °o Remove items at home which could result in a fall. This includes throw rugs or furniture in walking pathways °o ICE to the affected joint every three hours while awake for 30 minutes at a time, for at least the first 3-5 days, and then as needed for pain and swelling.  Continue to use ice for pain and swelling. You may notice swelling that will progress down to the foot and ankle.  This is normal after surgery.  Elevate your leg when you are not up walking on it.   °o Continue to use the breathing machine you got in the hospital (incentive spirometer) which will help keep your temperature down.  It is common for your temperature to cycle up and down following surgery, especially at night when you are not up moving around and exerting yourself.  The breathing machine keeps your lungs expanded and your temperature down. ° ° °DIET:  As you were doing prior to hospitalization, we recommend a well-balanced diet. ° °DRESSING / WOUND CARE / SHOWERING ° °You may change your dressing 3-5 days after surgery.  Then change the dressing every day with sterile gauze.  Please use good hand washing techniques before changing the dressing.  Do not use any lotions or creams on the incision until instructed by your surgeon. ° °ACTIVITY ° °o Increase activity slowly as tolerated, but follow the weight bearing instructions below.   °o No driving for 6 weeks or until further direction given by your physician.  You cannot drive while taking narcotics.  °o No lifting or carrying greater than 10 lbs. until further directed by your surgeon. °o Avoid periods of inactivity such as sitting longer than an hour when not asleep. This helps prevent blood clots.  °o You may return to work once you are authorized by your doctor.  ° ° ° °WEIGHT BEARING  ° °Weight bearing as tolerated with assist device (walker, cane, etc) as directed, use it as long as suggested by your surgeon or therapist, typically at  least 4-6 weeks. ° ° °EXERCISES ° °Results after joint replacement surgery are often greatly improved when you follow the exercise, range of motion and muscle strengthening exercises prescribed by your doctor. Safety measures are also important to protect the joint from further injury. Any time any of these exercises cause you to have increased pain or swelling, decrease what you are doing until you are comfortable again and then slowly increase them. If you have problems or questions, call your caregiver or physical therapist for advice.  ° °Rehabilitation is important following a joint replacement. After just a few days of immobilization, the muscles of the leg can become weakened and shrink (atrophy).  These exercises are designed to build up the tone and strength of the thigh and leg muscles and to improve motion. Often times heat used for twenty to thirty minutes before working out will loosen up your tissues and help with improving the range of motion but do not use heat for the first two weeks following surgery (sometimes heat can increase post-operative swelling).  ° °These exercises can be done on a training (exercise) mat, on the floor, on a table or on a bed. Use whatever works the best and is most comfortable for you.    Use music or television while you are exercising so that the exercises are a pleasant break in your day. This will make your life better with the exercises acting as a break   in your routine that you can look forward to.   Perform all exercises about fifteen times, three times per day or as directed.  You should exercise both the operative leg and the other leg as well. ° °Exercises include: °  °• Quad Sets - Tighten up the muscle on the front of the thigh (Quad) and hold for 5-10 seconds.   °• Straight Leg Raises - With your knee straight (if you were given a brace, keep it on), lift the leg to 60 degrees, hold for 3 seconds, and slowly lower the leg.  Perform this exercise against  resistance later as your leg gets stronger.  °• Leg Slides: Lying on your back, slowly slide your foot toward your buttocks, bending your knee up off the floor (only go as far as is comfortable). Then slowly slide your foot back down until your leg is flat on the floor again.  °• Angel Wings: Lying on your back spread your legs to the side as far apart as you can without causing discomfort.  °• Hamstring Strength:  Lying on your back, push your heel against the floor with your leg straight by tightening up the muscles of your buttocks.  Repeat, but this time bend your knee to a comfortable angle, and push your heel against the floor.  You may put a pillow under the heel to make it more comfortable if necessary.  ° °A rehabilitation program following joint replacement surgery can speed recovery and prevent re-injury in the future due to weakened muscles. Contact your doctor or a physical therapist for more information on knee rehabilitation.  ° ° °CONSTIPATION ° °Constipation is defined medically as fewer than three stools per week and severe constipation as less than one stool per week.  Even if you have a regular bowel pattern at home, your normal regimen is likely to be disrupted due to multiple reasons following surgery.  Combination of anesthesia, postoperative narcotics, change in appetite and fluid intake all can affect your bowels.  ° °YOU MUST use at least one of the following options; they are listed in order of increasing strength to get the job done.  They are all available over the counter, and you may need to use some, POSSIBLY even all of these options:   ° °Drink plenty of fluids (prune juice may be helpful) and high fiber foods °Colace 100 mg by mouth twice a day  °Senokot for constipation as directed and as needed Dulcolax (bisacodyl), take with full glass of water  °Miralax (polyethylene glycol) once or twice a day as needed. ° °If you have tried all these things and are unable to have a bowel  movement in the first 3-4 days after surgery call either your surgeon or your primary doctor.   ° °If you experience loose stools or diarrhea, hold the medications until you stool forms back up.  If your symptoms do not get better within 1 week or if they get worse, check with your doctor.  If you experience "the worst abdominal pain ever" or develop nausea or vomiting, please contact the office immediately for further recommendations for treatment. ° ° °ITCHING:  If you experience itching with your medications, try taking only a single pain pill, or even half a pain pill at a time.  You can also use Benadryl over the counter for itching or also to help with sleep.  ° °TED HOSE STOCKINGS:  Use stockings on both legs until for at least 2 weeks or as   directed by physician office. They may be removed at night for sleeping.  MEDICATIONS:  See your medication summary on the After Visit Summary that nursing will review with you.  You may have some home medications which will be placed on hold until you complete the course of blood thinner medication.  It is important for you to complete the blood thinner medication as prescribed.  PRECAUTIONS:  If you experience chest pain or shortness of breath - call 911 immediately for transfer to the hospital emergency department.   If you develop a fever greater that 101 F, purulent drainage from wound, increased redness or drainage from wound, foul odor from the wound/dressing, or calf pain - CONTACT YOUR SURGEON.                                                   FOLLOW-UP APPOINTMENTS:  If you do not already have a post-op appointment, please call the office for an appointment to be seen by your surgeon.  Guidelines for how soon to be seen are listed in your After Visit Summary, but are typically between 1-4 weeks after surgery.  OTHER INSTRUCTIONS:   Knee Replacement:  Do not place pillow under knee, focus on keeping the knee straight while resting. CPM  instructions: 0-90 degrees, 2 hours in the morning, 2 hours in the afternoon, and 2 hours in the evening. Place foam block, curve side up under heel at all times except when in CPM or when walking.  DO NOT modify, tear, cut, or change the foam block in any way.  MAKE SURE YOU:   Understand these instructions.   Get help right away if you are not doing well or get worse.    Thank you for letting us be a part of your medical care team.  It is a privilege we respect greatly.  We hope these instructions will help you stay on track for a fast and full recovery!    Information on my medicine - XARELTO (Rivaroxaban)  This medication education was reviewed with me or my healthcare representative as part of my discharge preparation.    Why was Xarelto prescribed for you? Xarelto was prescribed for you to reduce the risk of blood clots forming after orthopedic surgery. The medical term for these abnormal blood clots is venous thromboembolism (VTE).  What do you need to know about xarelto ? Take your Xarelto 10 mg ONCE DAILY at the same time every day. You may take it either with or without food.  If you have difficulty swallowing the tablet whole, you may crush it and mix in applesauce just prior to taking your dose.  Take Xarelto exactly as prescribed by your doctor and DO NOT stop taking Xarelto without talking to the doctor who prescribed the medication.  Stopping without other VTE prevention medication to take the place of Xarelto may increase your risk of developing a clot.  After discharge, you should have regular check-up appointments with your healthcare provider that is prescribing your Xarelto.    What do you do if you miss a dose? If you miss a dose, take it as soon as you remember on the same day then continue your regularly scheduled once daily regimen the next day. Do not take two doses of Xarelto on the same day.   Important Safety Information A possible  side effect of  Xarelto is bleeding. You should call your healthcare provider right away if you experience any of the following: ? Bleeding from an injury or your nose that does not stop. ? Unusual colored urine (red or dark brown) or unusual colored stools (red or black). ? Unusual bruising for unknown reasons. ? A serious fall or if you hit your head (even if there is no bleeding).  Some medicines may interact with Xarelto and might increase your risk of bleeding while on Xarelto. To help avoid this, consult your healthcare provider or pharmacist prior to using any new prescription or non-prescription medications, including herbals, vitamins, non-steroidal anti-inflammatory drugs (NSAIDs) and supplements.  This website has more information on Xarelto: [email or web address]

## 2014-11-12 NOTE — Progress Notes (Signed)
Patient ID: [name], female   DOB: 28-Oct-1945, 69 y.o.   MRN: [id number]     Subjective:  Patient reports pain as mild.  Patient states that she is doing much better and would like to be able to go home tomorrow.  Denies any CP or SOB.  Objective:   VITALS:   Filed Vitals:   11/12/14 0212 11/12/14 0214 11/12/14 0622 11/12/14 0632  BP: 105/48  86/42 100/60  Pulse: 81  68   Temp: 99 F (37.2 C)  99.1 F (37.3 C)   TempSrc: Oral  Oral   Resp: 17  14   Height:      Weight:      SpO2:  93% 93%     ABD soft Sensation intact distally Dorsiflexion/Plantar flexion intact Incision: dressing C/D/I and scant drainage Good foot and ankle motion  Lab Results  Component Value Date   WBC 11.7* 11/12/2014   HGB 11.1* 11/12/2014   HCT 33.5* 11/12/2014   MCV 87.7 11/12/2014   PLT 158 11/12/2014   BMET    Component Value Date/Time   NA 135 11/12/2014 0448   K 4.2 11/12/2014 0448   CL 101 11/12/2014 0448   CO2 26 11/12/2014 0448   GLUCOSE 205* 11/12/2014 0448   BUN 11 11/12/2014 0448   CREATININE 0.89 11/12/2014 0448   CALCIUM 8.4* 11/12/2014 0448   GFRNONAA >60 11/12/2014 0448   GFRAA >60 11/12/2014 0448     Assessment/Plan: 1 Day Post-Op   Principal Problem:   Primary localized osteoarthritis of right hip   Advance diet Up with therapy Plan for discharge tomorrow WBAT Dry dressing PRN    DOUGLAS PARRY, BRANDON 11/12/2014, 7:06 AM  Discussed and agree with above.  See how PT goes as far as dispo planning.  Marchia Bond, MD Cell [phone]

## 2014-11-12 NOTE — Care Management Note (Signed)
Case Management Note  Patient Details  Name: [name] MRN: [id number] Date of Birth: 1945-07-16  Subjective/Objective:        S/p right total hip arthroplasty            Action/Plan: Set up with [facility] for HHPT by MD office. Spoke with patient and her daughter, no change in discharge plan. Patient stated that she has a rolling walker, will need 3N1. Contacted James with [facility] and requested 3N1 be delivered to patient's room.  Patient stated that she will have family available to assist her after discharge.     Expected Discharge Date:                  Expected Discharge Plan:  [place]  In-House Referral:  NA  Discharge planning Services  CM Consult  Post Acute Care Choice:  Durable Medical Equipment, Home Health Choice offered to:  Patient  DME Arranged:  3-N-1 DME Agency:  [place]:  PT [place]:  [place]  Status of Service:  Completed, signed off  Medicare Important Message Given:    Date Medicare IM Given:    Medicare IM give by:    Date Additional Medicare IM Given:    Additional Medicare Important Message give by:     If discussed at [place] of Stay Meetings, dates discussed:    Additional Comments:  Nila Nephew, RN 11/12/2014, 2:40 PM

## 2014-11-12 NOTE — Progress Notes (Signed)
Physical Therapy Treatment Patient Details Name: [name] MRN: [id number] DOB: 06/17/1945 Today's Date: 11/12/2014    History of Present Illness Patient is a 69 y/o female s/p R THA, posterior approach. PMH includes HTN, PNA, depression.    PT Comments    Patient progressing with distance with gait this session.  Cues for technique with walker and posture throughout.  Patient fatigued initially due to finishing with OT about 10 min prior.  Will see later this pm to progress as tolerated.  Follow Up Recommendations  Home health PT;Supervision/Assistance - 24 hour     Equipment Recommendations  None recommended by PT    Recommendations for Other Services       Precautions / Restrictions Precautions Precautions: Posterior Hip;Fall Precaution Booklet Issued: Yes (comment) Precaution Comments: Reviewed precautions. Restrictions RLE Weight Bearing: Weight bearing as tolerated    Mobility  Bed Mobility               General bed mobility comments: NT, patient OOB  Transfers Overall transfer level: Needs assistance Equipment used: Rolling walker (2 wheeled) Transfers: Sit to/from Stand Sit to Stand: Min guard         General transfer comment: cues for hand placement, scooting to edge of chair  Ambulation/Gait Ambulation/Gait assistance: Min guard Ambulation Distance (Feet): 80 Feet Assistive device: Rolling walker (2 wheeled) Gait Pattern/deviations: Step-through pattern;Decreased stride length;Shuffle;Trunk flexed     General Gait Details: cues for technique for minimizing weight on right LE if painful, patient continued shuffling gait with flexed posture despite cues   Stairs            Wheelchair Mobility    Modified Rankin (Stroke Patients Only)       Balance           Standing balance support: Bilateral upper extremity supported Standing balance-Leahy Scale: Poor Standing balance comment: UE needed for balance esp with flexed  posture                    Cognition Arousal/Alertness: Awake/alert Behavior During Therapy: WFL for tasks assessed/performed Overall Cognitive Status: Within Functional Limits for tasks assessed                      Exercises Total Joint Exercises Ankle Circles/Pumps: Both;10 reps;Seated Quad Sets: Both;10 reps;Seated Short Arc Quad: AROM;10 reps;Seated;Right Heel Slides: AAROM;Right;10 reps;Seated Hip ABduction/ADduction: AROM;10 reps;Seated;Right    General Comments        Pertinent Vitals/Pain Pain Score: 4  Pain Location: right hip with weight bearing Pain Descriptors / Indicators: Aching Pain Intervention(s): Repositioned;Ice applied;Monitored during session    Home Living                      Prior Function            PT Goals (current goals can now be found in the care plan section) Progress towards PT goals: Progressing toward goals    Frequency  7X/week    PT Plan Current plan remains appropriate    Co-evaluation             End of Session Equipment Utilized During Treatment: Gait belt Activity Tolerance: Patient limited by fatigue Patient left: in chair;with call bell/phone within reach;with family/visitor present     Time: 7939-0300 PT Time Calculation (min) (ACUTE ONLY): 33 min  Charges:  $Gait Training: 8-22 mins $Therapeutic Exercise: 8-22 mins  G Codes:      WYNN,CYNDI 11/12/2014, 11:36 AM  [name], PT [phone] 11/12/2014

## 2014-11-13 LAB — CBC
HCT: 33.6 % — ABNORMAL LOW (ref 36.0–46.0)
HEMOGLOBIN: 10.9 g/dL — AB (ref 12.0–15.0)
MCH: 29.1 pg (ref 26.0–34.0)
MCHC: 32.4 g/dL (ref 30.0–36.0)
MCV: 89.6 fL (ref 78.0–100.0)
Platelets: 165 10*3/uL (ref 150–400)
RBC: 3.75 MIL/uL — AB (ref 3.87–5.11)
RDW: 12.9 % (ref 11.5–15.5)
WBC: 9.8 10*3/uL (ref 4.0–10.5)

## 2014-11-13 LAB — BASIC METABOLIC PANEL
ANION GAP: 6 (ref 5–15)
BUN: 11 mg/dL (ref 6–20)
CHLORIDE: 102 mmol/L (ref 101–111)
CO2: 26 mmol/L (ref 22–32)
Calcium: 8.1 mg/dL — ABNORMAL LOW (ref 8.9–10.3)
Creatinine, Ser: 0.67 mg/dL (ref 0.44–1.00)
GFR calc Af Amer: >60 mL/min (ref >60)
GFR calc non Af Amer: >60 mL/min (ref >60)
GLUCOSE: 126 mg/dL — AB (ref 65–99)
POTASSIUM: 4.4 mmol/L (ref 3.5–5.1)
Sodium: 134 mmol/L — ABNORMAL LOW (ref 135–145)

## 2014-11-13 NOTE — Progress Notes (Signed)
Physical Therapy Treatment Patient Details Name: [name] MRN: [id number] DOB: 17-Jun-1945 Today's Date: 11/13/2014    History of Present Illness Patient is a 69 y/o female s/p R THA, posterior approach. PMH includes HTN, PNA, depression.    PT Comments    Patient progressing well towards PT goals. Continues to have difficulty recalling hip precautions and incorporating them into functional mobility/tasks esp during turns with RW. Requires Min guard-Min A for mobility. Has difficulty with bed mobility. Will plan for family training in PM session to be able to better assist patient at home. Will follow acutely per current POC.   Follow Up Recommendations  Home health PT;Supervision/Assistance - 24 hour     Equipment Recommendations  None recommended by PT    Recommendations for Other Services       Precautions / Restrictions Precautions Precautions: Posterior Hip;Fall Precaution Booklet Issued: Yes (comment) Precaution Comments: Reviewed precautions. pt only able to verbalize 1/3. Required Braces or Orthoses: Other Brace/Splint Other Brace/Splint: hip abduction wedge Restrictions Weight Bearing Restrictions: Yes RLE Weight Bearing: Weight bearing as tolerated    Mobility  Bed Mobility Overal bed mobility: Needs Assistance Bed Mobility: Supine to Sit     Supine to sit: Min assist     General bed mobility comments: assist for right LE and cues for technique. HOB flat, no use of rails to simulate home. Increased time and effort.   Transfers Overall transfer level: Needs assistance Equipment used: Rolling walker (2 wheeled) Transfers: Sit to/from Stand Sit to Stand: Min guard;Supervision         General transfer comment: Min guard vs supervision for safety depending on surface.  Ambulation/Gait Ambulation/Gait assistance: Min guard Ambulation Distance (Feet): 100 Feet (x2 bouts) Assistive device: Rolling walker (2 wheeled) Gait Pattern/deviations:  Step-to pattern;Decreased step length - left;Decreased stance time - right;Antalgic   Gait velocity interpretation: <1.8 ft/sec, indicative of risk for recurrent falls General Gait Details: Min guard for safety. cues for step length and for safe technique when turning to adhere to hip precautions. 1 seated rest break. a few short standing rest breaks.   Stairs            Wheelchair Mobility    Modified Rankin (Stroke Patients Only)       Balance Overall balance assessment: Needs assistance Sitting-balance support: Feet supported;Single extremity supported Sitting balance-Leahy Scale: Fair     Standing balance support: During functional activity Standing balance-Leahy Scale: Poor                      Cognition Arousal/Alertness: Awake/alert Behavior During Therapy: WFL for tasks assessed/performed Overall Cognitive Status: Within Functional Limits for tasks assessed       Memory: Decreased recall of precautions              Exercises Total Joint Exercises Ankle Circles/Pumps: Both;10 reps;Supine Long Arc Quad: Right;10 reps;Seated    General Comments General comments (skin integrity, edema, etc.): NO family present for PT session.      Pertinent Vitals/Pain Pain Assessment: 0-10 Pain Score: 5  Pain Location: right hip Pain Descriptors / Indicators: Sore;Aching Pain Intervention(s): Monitored during session;Repositioned;Premedicated before session    Home Living                      Prior Function            PT Goals (current goals can now be found in the care plan section) Progress towards PT  goals: Progressing toward goals    Frequency  7X/week    PT Plan Current plan remains appropriate    Co-evaluation             End of Session Equipment Utilized During Treatment: Gait belt Activity Tolerance: Patient tolerated treatment well Patient left: in chair;with call bell/phone within reach     Time: 0932-1000 PT  Time Calculation (min) (ACUTE ONLY): 28 min  Charges:  $Gait Training: 8-22 mins $Therapeutic Activity: 8-22 mins                    G Codes:      Shauna A Hartshorne 11/13/2014, 10:15 AM  Wray Kearns, [place], DPT [phone]

## 2014-11-13 NOTE — Progress Notes (Signed)
Physical Therapy Treatment Patient Details Name: [name] MRN: [id number] DOB: 11-16-1945 Today's Date: 11/13/2014    History of Present Illness Patient is a 69 y/o female s/p R THA, posterior approach. PMH includes HTN, PNA, depression.    PT Comments    Patient progressing slowly towards PT goals. Performed family training this session with focus on bed mobility, hip precautions and safe mobility. Discussed car transfer. Pt able to verbalize 3/3 hip precautions today independently. Continues to have some difficulty incorporating them into mobility without cues. Pt plans to discharge today. Will follow acutely if still in hospital tomorrow.   Follow Up Recommendations  Home health PT;Supervision/Assistance - 24 hour     Equipment Recommendations  None recommended by PT    Recommendations for Other Services       Precautions / Restrictions Precautions Precautions: Posterior Hip;Fall Precaution Booklet Issued: Yes (comment) Precaution Comments: Reviewed precautions.  Required Braces or Orthoses: Other Brace/Splint Other Brace/Splint: hip abduction wedge Restrictions Weight Bearing Restrictions: Yes RLE Weight Bearing: Weight bearing as tolerated    Mobility  Bed Mobility Overal bed mobility: Needs Assistance Bed Mobility: Supine to Sit     Supine to sit: Min assist     General bed mobility comments: assist for right LE and cues for technique. HOB flat, no use of rails to simulate home. Increased time and effort. Showed family how to assist with transfer.  Transfers Overall transfer level: Needs assistance Equipment used: Rolling walker (2 wheeled) Transfers: Sit to/from Stand Sit to Stand: Supervision         General transfer comment: Supervision for safety. Stood from [employer], from chair x1, from toilet x1. Good demo of technique. Transferred to chair post ambulation bout.  Ambulation/Gait Ambulation/Gait assistance: Min guard Ambulation Distance  (Feet): 100 Feet Assistive device: Rolling walker (2 wheeled) Gait Pattern/deviations: Step-through pattern;Step-to pattern;Decreased stance time - right;Decreased step length - left;Antalgic   Gait velocity interpretation: <1.8 ft/sec, indicative of risk for recurrent falls General Gait Details: Min guard for safety. cues for step length and for safe technique when turning to adhere to hip precautions.    Stairs            Wheelchair Mobility    Modified Rankin (Stroke Patients Only)       Balance Overall balance assessment: Needs assistance Sitting-balance support: Feet supported;Single extremity supported Sitting balance-Leahy Scale: Fair     Standing balance support: During functional activity Standing balance-Leahy Scale: Fair Standing balance comment: Able to perform peri care and pull up underwear without UE support.                     Cognition Arousal/Alertness: Awake/alert Behavior During Therapy: WFL for tasks assessed/performed Overall Cognitive Status: Within Functional Limits for tasks assessed                      Exercises      General Comments        Pertinent Vitals/Pain Pain Assessment: Faces Faces Pain Scale: Hurts little more Pain Location: right hip Pain Descriptors / Indicators: Sore;Aching Pain Intervention(s): Monitored during session;Premedicated before session;Repositioned    Home Living                      Prior Function            PT Goals (current goals can now be found in the care plan section) Progress towards PT goals: Progressing toward goals  Frequency  7X/week    PT Plan Current plan remains appropriate    Co-evaluation             End of Session Equipment Utilized During Treatment: Gait belt Activity Tolerance: Patient tolerated treatment well Patient left: in chair;with call bell/phone within reach;with family/visitor present     Time: 7116-5790 PT Time Calculation  (min) (ACUTE ONLY): 28 min  Charges:  $Gait Training: 8-22 mins $Therapeutic Activity: 8-22 mins                    G Codes:      Candise Crabtree A Mavryk Pino 11/13/2014, 2:50 PM Wray Kearns, [place], DPT [phone]

## 2014-11-13 NOTE — Discharge Summary (Signed)
Physician Discharge Summary  Patient ID: [name] MRN: [id number] DOB/AGE: [birth year] 69 y.o.  Admit date: 11/11/2014 Discharge date: 11/13/2014  Admission Diagnoses:  Primary localized osteoarthritis of right hip  Discharge Diagnoses:  Principal Problem:   Primary localized osteoarthritis of right hip   Past Medical History  Diagnosis Date  . Hyperlipidemia   . Shortness of breath     with exertion  . Hypothyroidism   . Arthritis   . GERD (gastroesophageal reflux disease)   . IBS (irritable bowel syndrome)   . Heart murmur     MILD, NO CARDIOLOGIST  . Pneumonia   . Depression   . Diverticulitis   . Umbilical hernia   . Primary localized osteoarthritis of right hip 11/11/2014    Surgeries: Procedure(s): TOTAL RIGHT HIP ARTHROPLASTY on 11/11/2014   Consultants (if any):    Discharged Condition: Improved  Hospital Course: [name] is an 69 y.o. female who was admitted 11/11/2014 with a diagnosis of Primary localized osteoarthritis of right hip and went to the operating room on 11/11/2014 and underwent the above named procedures.    She was given perioperative antibiotics:  Anti-infectives    Start     Dose/Rate Route Frequency Ordered Stop   11/11/14 1600  ceFAZolin (ANCEF) IVPB 2 g/50 mL premix     2 g 100 mL/hr over 30 Minutes Intravenous Every 6 hours 11/11/14 1511 11/11/14 2230   11/11/14 0643  ceFAZolin (ANCEF) 2-3 GM-% IVPB SOLR    Comments:  Ardine Eng   : cabinet override      11/11/14 0643 11/11/14 0735   11/11/14 0641  ceFAZolin (ANCEF) IVPB 2 g/50 mL premix  Status:  Discontinued     2 g 100 mL/hr over 30 Minutes Intravenous On call to O.R. 11/11/14 3664 11/11/14 1459    .  She was given sequential compression devices, early ambulation, and xarelto for DVT prophylaxis.  She benefited maximally from the hospital stay and there were no complications.    Recent vital signs:  Filed Vitals:   11/13/14 0509  BP: 110/48  Pulse:  86  Temp: 99 F (37.2 C)  Resp: 16    Recent laboratory studies:  Lab Results  Component Value Date   HGB 10.9* 11/13/2014   HGB 11.1* 11/12/2014   HGB 14.1 10/31/2014   Lab Results  Component Value Date   WBC 9.8 11/13/2014   PLT 165 11/13/2014   No results found for: INR Lab Results  Component Value Date   NA 134* 11/13/2014   K 4.4 11/13/2014   CL 102 11/13/2014   CO2 26 11/13/2014   BUN 11 11/13/2014   CREATININE 0.67 11/13/2014   GLUCOSE 126* 11/13/2014    Discharge Medications:     Medication List    STOP taking these medications        acetaminophen 650 MG CR tablet  Commonly known as:  TYLENOL      TAKE these medications        baclofen 10 MG tablet  Commonly known as:  LIORESAL  Take 1 tablet (10 mg total) by mouth 3 (three) times daily. As needed for muscle spasm     levothyroxine 88 MCG tablet  Commonly known as:  SYNTHROID, LEVOTHROID  Take 88 mcg by mouth daily before breakfast.     lubiprostone 24 MCG capsule  Commonly known as:  AMITIZA  Take 1 capsule (24 mcg total) by mouth daily with breakfast.     ondansetron 4  MG tablet  Commonly known as:  ZOFRAN  Take 1 tablet (4 mg total) by mouth every 8 (eight) hours as needed for nausea or vomiting.     oxyCODONE-acetaminophen 10-325 MG per tablet  Commonly known as:  PERCOCET  Take 1-2 tablets by mouth every 6 (six) hours as needed for pain. MAXIMUM TOTAL ACETAMINOPHEN DOSE IS 4000 MG PER DAY     pantoprazole 40 MG tablet  Commonly known as:  PROTONIX  Take 1 tablet (40 mg total) by mouth daily.     polyethylene glycol powder powder  Commonly known as:  GLYCOLAX/MIRALAX  Take 17 g by mouth at bedtime.     propranolol 80 MG tablet  Commonly known as:  INDERAL  Take 80 mg by mouth every evening.     rivaroxaban 10 MG Tabs tablet  Commonly known as:  XARELTO  Take 1 tablet (10 mg total) by mouth daily.     sennosides-docusate sodium 8.6-50 MG tablet  Commonly known as:  SENOKOT-S   Take 2 tablets by mouth daily.        Diagnostic Studies: Dg Hip Port Unilat With Pelvis 1v Right  11/11/2014   CLINICAL DATA:  Postop right hip replacement  EXAM: DG HIP (WITH OR WITHOUT PELVIS) 1V PORT RIGHT  COMPARISON:  None.  FINDINGS: Changes of right hip replacement. No hardware or bony complicating feature. Normal alignment.  IMPRESSION: Right knee replacement without complicating feature.   Electronically Signed   By: Rolm Baptise M.D.   On: 11/11/2014 10:57    Disposition: 01-Home or Self Care      Discharge Instructions    Posterior total hip precautions    Complete by:  As directed      Weight bearing as tolerated    Complete by:  As directed            Follow-up Information    Follow up with Johnny Bridge, MD. Schedule an appointment as soon as possible for a visit in 2 weeks.   Specialty:  Orthopedic Surgery   Contact information:   [place] [address] [phone]        Signed: Johnny Bridge 11/13/2014, 12:02 PM

## 2014-11-13 NOTE — Progress Notes (Signed)
Patient ID: [name], female   DOB: April 12, 1945, 69 y.o.   MRN: [id number]     Subjective:  Patient reports pain as mild.  Patient states that she is doing well and would like to go home and that she only has an increase in pain when she is up and walking  Objective:   VITALS:   Filed Vitals:   11/12/14 1430 11/12/14 2038 11/13/14 0125 11/13/14 0509  BP: 101/38 106/48 108/39 110/48  Pulse: 81 85 91 86  Temp: 98.6 F (37 C) 99 F (37.2 C) 99.1 F (37.3 C) 99 F (37.2 C)  TempSrc:  Oral Oral   Resp: 16 16 16 16   Height:      Weight:      SpO2: 98% 92% 92% 94%    ABD soft Sensation intact distally Dorsiflexion/Plantar flexion intact Incision: dressing C/D/I and no drainage Good foot and ankle motion  Lab Results  Component Value Date   WBC 9.8 11/13/2014   HGB 10.9* 11/13/2014   HCT 33.6* 11/13/2014   MCV 89.6 11/13/2014   PLT 165 11/13/2014   BMET    Component Value Date/Time   NA 134* 11/13/2014 0529   K 4.4 11/13/2014 0529   CL 102 11/13/2014 0529   CO2 26 11/13/2014 0529   GLUCOSE 126* 11/13/2014 0529   BUN 11 11/13/2014 0529   CREATININE 0.67 11/13/2014 0529   CALCIUM 8.1* 11/13/2014 0529   GFRNONAA >60 11/13/2014 0529   GFRAA >60 11/13/2014 0529     Assessment/Plan: 2 Days Post-Op   Principal Problem:   Primary localized osteoarthritis of right hip   Advance diet Up with therapy Discharge home with home health Plan for DC after PT this AM.   Rande Brunt, BRANDON 11/13/2014, 6:54 AM  Seen and agree with above.  Overall doing well.   Marchia Bond, MD Cell [phone]

## 2014-11-13 NOTE — Progress Notes (Signed)
Waste 5 mg Oxy IR in sharps box - package opened but not administered. Unable to waste 5 mg Oxy IR in [facility] - patient d/c'ed earlier this afternoon and now the patient's name has been dropped from the [place] census list. Pharmacy advised to document waste in a progress note and to have the witness RN add a waste note for documentation.

## 2014-11-13 NOTE — Progress Notes (Signed)
D/C teaching completed with patient and family - reviewed AVS printout, medications administered this morning, Rx's for home and pain Rx. All questions answered. Received pain Rx for d/c. Family has taken personal items to the car. Taken by tech via w/c to meet car for drive home front [employer]	[place].

## 2014-11-13 NOTE — Progress Notes (Signed)
Witnessed Audrea Muscat RN waste 5mg  oxycodone IR in med room sharps. Package had been opened, but was not given b/c pt declined, then pt discharged.   [place], Jerry Caras

## 2015-03-09 ENCOUNTER — Encounter: Payer: Self-pay | Admitting: Internal Medicine

## 2015-03-16 DIAGNOSIS — Z1231 Encounter for screening mammogram for malignant neoplasm of breast: Secondary | ICD-10-CM | POA: Diagnosis not present

## 2015-03-17 ENCOUNTER — Ambulatory Visit (INDEPENDENT_AMBULATORY_CARE_PROVIDER_SITE_OTHER): Payer: Medicare Other | Admitting: Internal Medicine

## 2015-03-17 ENCOUNTER — Other Ambulatory Visit: Payer: Self-pay

## 2015-03-17 ENCOUNTER — Encounter: Payer: Self-pay | Admitting: Internal Medicine

## 2015-03-17 VITALS — BP 108/64 | HR 80 | Temp 97.6°F | Ht 67.0 in | Wt 188.8 lb

## 2015-03-17 DIAGNOSIS — R194 Change in bowel habit: Secondary | ICD-10-CM | POA: Diagnosis not present

## 2015-03-17 DIAGNOSIS — K921 Melena: Secondary | ICD-10-CM | POA: Diagnosis not present

## 2015-03-17 DIAGNOSIS — Z86010 Personal history of colonic polyps: Secondary | ICD-10-CM

## 2015-03-17 MED ORDER — PEG 3350-KCL-NA BICARB-NACL 420 G PO SOLR: 4000.0000 mL | ORAL | Status: DC

## 2015-03-17 NOTE — Patient Instructions (Signed)
Schedule diagnostic colonoscopy - hemocult positive stool and history of colonic adenoma  Split prep  Further recommendations to follow

## 2015-03-17 NOTE — Progress Notes (Signed)
Primary Care Physician:  Glenda Chroman., MD Primary Gastroenterologist:  Dr. Gala Romney  Pre-Procedure History & Physical: HPI:  [name] is a 70 y.o. female here at the request of Dr. Wilber Bihari for being found to be occult blood positive on stool sampling. Patient had normal hemoglobin last month. Has had some intermittent abdominal cramps alternating constipation diarrhea. Has had a hold Amitiza intermittently because of diarrhea. GERD symptoms well controlled on Protonix 40 mg daily. Last colonoscopy about 3 years ago-diverticulosis; history of colonic adenoma before that period; slated to have a surveillance colonoscopy 2019.  Past Medical History  Diagnosis Date  . Hyperlipidemia   . Shortness of breath     with exertion  . Hypothyroidism   . Arthritis   . GERD (gastroesophageal reflux disease)   . IBS (irritable bowel syndrome)   . Heart murmur     MILD, NO CARDIOLOGIST  . Pneumonia   . Depression   . Diverticulitis   . Umbilical hernia   . Primary localized osteoarthritis of right hip 11/11/2014    Past Surgical History  Procedure Laterality Date  . Cesarean section  1967  . Tubal ligation  1970's  . Colonoscopy  2011    Dr. Arnoldo Morale: sigmoid diverticulosis, repeat Aug 2016 due to history of adenomatous polyps  . Esophagogastroduodenoscopy  2011    Dr. Arnoldo Morale: multiple gastric polyps, path with mild chronic gastritis  . Cholecystectomy  01/10/2011    Procedure: LAPAROSCOPIC CHOLECYSTECTOMY;  Surgeon: Scherry Ran;  Location: [facility];  Service: General;  Laterality: N/A;  . Cataract extraction w/phaco Left 05/03/2012    Procedure: CATARACT EXTRACTION PHACO AND INTRAOCULAR LENS PLACEMENT (IOC);  Surgeon: Tonny Branch, MD;  Location: [facility];  Service: Ophthalmology;  Laterality: Left;  CDE: 16.80  . Cataract extraction w/phaco Right 05/21/2012    Procedure: CATARACT EXTRACTION PHACO AND INTRAOCULAR LENS PLACEMENT (IOC);  Surgeon: Tonny Branch, MD;  Location: [facility];  Service:  Ophthalmology;  Laterality: Right;  CDE=12.31  . Colonoscopy N/A 02/04/2013    Dr. Gala Romney :Colonic diverticulosis, repeat in 2019  . Esophagogastroduodenoscopy N/A 02/04/2013    Dr. Lakenya Riendeau:Small hiatal hernia. Gastric polyps s/p bx, negative H.pylori. Benign path.   . Abdominal hysterectomy  2003    OVARIES REMOVED  . Eus N/A 08/29/2013    Dr. Ardis Hughs: no clear cause for acute pancreatitis. Autoimmune labs normal. Question HCTZ as culprit  . Total hip arthroplasty Right 11/11/2014    Procedure: TOTAL RIGHT HIP ARTHROPLASTY;  Surgeon: Marchia Bond, MD;  Location: [facility];  Service: Orthopedics;  Laterality: Right;    Prior to Admission medications   Medication Sig Start Date End Date Taking? Authorizing Provider  levothyroxine (SYNTHROID, LEVOTHROID) 88 MCG tablet Take 88 mcg by mouth daily before breakfast.    Yes Historical Provider, MD  lubiprostone (AMITIZA) 24 MCG capsule Take 1 capsule (24 mcg total) by mouth daily with breakfast. Patient taking differently: Take 24 mcg by mouth daily as needed for constipation.  03/06/14  Yes Orvil Feil, NP  pantoprazole (PROTONIX) 40 MG tablet Take 1 tablet (40 mg total) by mouth daily. 04/09/14  Yes Orvil Feil, NP  polyethylene glycol powder (GLYCOLAX/MIRALAX) powder Take 17 g by mouth at bedtime.   Yes Historical Provider, MD  propranolol (INDERAL) 80 MG tablet Take 80 mg by mouth every evening.    Yes Historical Provider, MD    Allergies as of 03/17/2015 - Review Complete 03/17/2015  Allergen Reaction Noted  . Metoclopramide hcl  Other (See Comments) 01/04/2011  . Aspirin  01/28/2013    Family History  Problem Relation Age of Onset  . Anesthesia problems Neg Hx   . Hypotension Neg Hx   . Malignant hyperthermia Neg Hx   . Pseudochol deficiency Neg Hx   . Colon cancer Neg Hx   . Pancreatitis Neg Hx     Social History   Social History  . Marital Status: Married    Spouse Name: N/A  . Number of Children: N/A  . Years of Education: N/A    Occupational History  . Not on file.   Social History Main Topics  . Smoking status: Never Smoker   . Smokeless tobacco: Never Used  . Alcohol Use: No  . Drug Use: No  . Sexual Activity: Yes    Birth Control/ Protection: None   Other Topics Concern  . Not on file   Social History Narrative    Review of Systems: See HPI, otherwise negative ROS  Physical Exam: BP 108/64 mmHg  Pulse 80  Temp(Src) 97.6 F (36.4 C) (Oral)  Ht 5\' 7"  (1.702 m)  Wt 188 lb 12.8 oz (85.639 kg)  BMI 29.56 kg/m2 General:   Alert, pleasant and cooperative in NAD Skin:  Intact without significant lesions or rashes. Eyes:  Sclera clear, no icterus.   Conjunctiva pink. Ears:  Normal auditory acuity. Nose:  No deformity, discharge,  or lesions. Mouth:  No deformity or lesions. Neck:  Supple; no masses or thyromegaly. No significant cervical adenopathy. Lungs:  Clear throughout to auscultation.   No wheezes, crackles, or rhonchi. No acute distress. Heart:  Regular rate and rhythm; no murmurs, clicks, rubs,  or gallops. Abdomen: Non-distended, normal bowel sounds.  Soft and nontender without appreciable mass or hepatosplenomegaly.  Pulses:  Normal pulses noted. Extremities:  1+ pitting lower tremor edema appear.  Impression:  Very pleasant 70 year old lady with some vague change in bowel habits recently found to be Hemoccult positive. No overt bleeding seen. Hemoglobin normal last month. Although she had a colonoscopy about 3 years ago, it would be prudent to reassess her lower GI tract at this time.  History of colonic adenoma. GERD symptoms well controlled on Protonix - No alarm upper GI tract symptoms  Recommendations: I have offered the patient diagnostic colonoscopy at this time - hemocult positive stool and history of colonic adenoma.  The risks, benefits, limitations, alternatives and imponderables have been reviewed with the patient. Questions have been answered. All parties are agreeable.   Split prep.  Continue Protonix daily. Further recommendations to follow    Notice: This dictation was prepared with Dragon dictation along with smaller phrase technology. Any transcriptional errors that result from this process are unintentional and may not be corrected upon review.

## 2015-04-07 ENCOUNTER — Other Ambulatory Visit: Payer: Self-pay

## 2015-04-09 ENCOUNTER — Encounter (HOSPITAL_COMMUNITY): Payer: Self-pay | Admitting: *Deleted

## 2015-04-09 ENCOUNTER — Encounter (HOSPITAL_COMMUNITY)
Admission: RE | Disposition: A | Discharge status: Home / Self Care | Payer: Self-pay | Source: Ambulatory Visit | Attending: Internal Medicine

## 2015-04-09 ENCOUNTER — Ambulatory Visit (HOSPITAL_COMMUNITY)
Admission: RE | Admit: 2015-04-09 | Discharge: 2015-04-09 | Disposition: A | Discharge status: Home / Self Care | Payer: Medicare Other | Source: Ambulatory Visit | Attending: Internal Medicine | Admitting: Internal Medicine

## 2015-04-09 DIAGNOSIS — R195 Other fecal abnormalities: Secondary | ICD-10-CM | POA: Insufficient documentation

## 2015-04-09 DIAGNOSIS — E785 Hyperlipidemia, unspecified: Secondary | ICD-10-CM | POA: Insufficient documentation

## 2015-04-09 DIAGNOSIS — Z860100 Personal history of colon polyps, unspecified: Secondary | ICD-10-CM | POA: Insufficient documentation

## 2015-04-09 DIAGNOSIS — Z96641 Presence of right artificial hip joint: Secondary | ICD-10-CM | POA: Diagnosis not present

## 2015-04-09 DIAGNOSIS — E039 Hypothyroidism, unspecified: Secondary | ICD-10-CM | POA: Insufficient documentation

## 2015-04-09 DIAGNOSIS — R194 Change in bowel habit: Secondary | ICD-10-CM | POA: Insufficient documentation

## 2015-04-09 DIAGNOSIS — K5730 Diverticulosis of large intestine without perforation or abscess without bleeding: Secondary | ICD-10-CM | POA: Insufficient documentation

## 2015-04-09 DIAGNOSIS — F329 Major depressive disorder, single episode, unspecified: Secondary | ICD-10-CM | POA: Insufficient documentation

## 2015-04-09 DIAGNOSIS — M1611 Unilateral primary osteoarthritis, right hip: Secondary | ICD-10-CM | POA: Insufficient documentation

## 2015-04-09 DIAGNOSIS — Z79899 Other long term (current) drug therapy: Secondary | ICD-10-CM | POA: Insufficient documentation

## 2015-04-09 DIAGNOSIS — D124 Benign neoplasm of descending colon: Secondary | ICD-10-CM | POA: Diagnosis not present

## 2015-04-09 DIAGNOSIS — R109 Unspecified abdominal pain: Secondary | ICD-10-CM | POA: Insufficient documentation

## 2015-04-09 DIAGNOSIS — K219 Gastro-esophageal reflux disease without esophagitis: Secondary | ICD-10-CM | POA: Insufficient documentation

## 2015-04-09 HISTORY — PX: COLONOSCOPY: SHX5424

## 2015-04-09 SURGERY — COLONOSCOPY
Anesthesia: Moderate Sedation

## 2015-04-09 MED ORDER — MEPERIDINE HCL 100 MG/ML IJ SOLN
INTRAMUSCULAR | Status: AC
  Filled 2015-04-09: qty 2

## 2015-04-09 MED ORDER — MEPERIDINE HCL 100 MG/ML IJ SOLN
INTRAMUSCULAR | Status: DC | PRN
  Administered 2015-04-09 (×3): 25 mg via INTRAVENOUS

## 2015-04-09 MED ORDER — MIDAZOLAM HCL 5 MG/5ML IJ SOLN
INTRAMUSCULAR | Status: DC | PRN
  Administered 2015-04-09: 2 mg via INTRAVENOUS
  Administered 2015-04-09 (×2): 1 mg via INTRAVENOUS
  Administered 2015-04-09: 2 mg via INTRAVENOUS

## 2015-04-09 MED ORDER — STERILE WATER FOR IRRIGATION IR SOLN
Status: DC | PRN
  Administered 2015-04-09: 2.5 mL

## 2015-04-09 MED ORDER — MIDAZOLAM HCL 5 MG/5ML IJ SOLN
INTRAMUSCULAR | Status: AC
  Filled 2015-04-09: qty 10

## 2015-04-09 MED ORDER — SODIUM CHLORIDE 0.9 % IV SOLN
INTRAVENOUS | Status: DC
  Administered 2015-04-09: 12:00:00 via INTRAVENOUS

## 2015-04-09 MED ORDER — ONDANSETRON HCL 4 MG/2ML IJ SOLN
INTRAMUSCULAR | Status: AC
  Filled 2015-04-09: qty 2

## 2015-04-09 NOTE — Interval H&P Note (Signed)
History and Physical Interval Note:  04/09/2015 1:07 PM  [name]  has presented today for surgery, with the diagnosis of HISTORY OF POLYPS  The various methods of treatment have been discussed with the patient and family. After consideration of risks, benefits and other options for treatment, the patient has consented to  Procedure(s) with comments: COLONOSCOPY (N/A) - 1100 - moved to 12:45 - office to notify  as a surgical intervention .  The patient's history has been reviewed, patient examined, no change in status, stable for surgery.  I have reviewed the patient's chart and labs.  Questions were answered to the patient's satisfaction.     [name]  No change. Diagnostic colonoscopy per plan.The risks, benefits, limitations, alternatives and imponderables have been reviewed with the patient. Questions have been answered. All parties are agreeable.

## 2015-04-09 NOTE — Discharge Instructions (Signed)
°Colonoscopy °Discharge Instructions ° °Read the instructions outlined below and refer to this sheet in the next few weeks. These discharge instructions provide you with general information on caring for yourself after you leave the hospital. Your doctor may also give you specific instructions. While your treatment has been planned according to the most current medical practices available, unavoidable complications occasionally occur. If you have any problems or questions after discharge, call Dr. Rourk at [phone]. °ACTIVITY °· You may resume your regular activity, but move at a slower pace for the next 24 hours.  °· Take frequent rest periods for the next 24 hours.  °· Walking will help get rid of the air and reduce the bloated feeling in your belly (abdomen).  °· No driving for 24 hours (because of the medicine (anesthesia) used during the test).   °· Do not sign any important legal documents or operate any machinery for 24 hours (because of the anesthesia used during the test).  °NUTRITION °· Drink plenty of fluids.  °· You may resume your normal diet as instructed by your doctor.  °· Begin with a light meal and progress to your normal diet. Heavy or fried foods are harder to digest and may make you feel sick to your stomach (nauseated).  °· Avoid alcoholic beverages for 24 hours or as instructed.  °MEDICATIONS °· You may resume your normal medications unless your doctor tells you otherwise.  °WHAT YOU CAN EXPECT TODAY °· Some feelings of bloating in the abdomen.  °· Passage of more gas than usual.  °· Spotting of blood in your stool or on the toilet paper.  °IF YOU HAD POLYPS REMOVED DURING THE COLONOSCOPY: °· No aspirin products for 7 days or as instructed.  °· No alcohol for 7 days or as instructed.  °· Eat a soft diet for the next 24 hours.  °FINDING OUT THE RESULTS OF YOUR TEST °Not all test results are available during your visit. If your test results are not back during the visit, make an appointment  with your caregiver to find out the results. Do not assume everything is normal if you have not heard from your caregiver or the medical facility. It is important for you to follow up on all of your test results.  °SEEK IMMEDIATE MEDICAL ATTENTION IF: °· You have more than a spotting of blood in your stool.  °· Your belly is swollen (abdominal distention).  °· You are nauseated or vomiting.  °· You have a temperature over 101.  °· You have abdominal pain or discomfort that is severe or gets worse throughout the day.  ° ° °Diverticulosis and colon polyp information provided ° °Further recommendations to follow pending review of pathology report ° ° °Diverticulosis °Diverticulosis is the condition that develops when small pouches (diverticula) form in the wall of your colon. Your colon, or large intestine, is where water is absorbed and stool is formed. The pouches form when the inside layer of your colon pushes through weak spots in the outer layers of your colon. °CAUSES  °No one knows exactly what causes diverticulosis. °RISK FACTORS °· Being older than 50. Your risk for this condition increases with age. Diverticulosis is rare in people younger than 40 years. By age 80, almost everyone has it. °· Eating a low-fiber diet. °· Being frequently constipated. °· Being overweight. °· Not getting enough exercise. °· Smoking. °· Taking over-the-counter pain medicines, like aspirin and ibuprofen. °SYMPTOMS  °Most people with diverticulosis do not have symptoms. °DIAGNOSIS  °  Because diverticulosis often has no symptoms, health care providers often discover the condition during an exam for other colon problems. In many cases, a health care provider will diagnose diverticulosis while using a flexible scope to examine the colon (colonoscopy). °TREATMENT  °If you have never developed an infection related to diverticulosis, you may not need treatment. If you have had an infection before, treatment may include: °· Eating more  fruits, vegetables, and grains. °· Taking a fiber supplement. °· Taking a live bacteria supplement (probiotic). °· Taking medicine to relax your colon. °HOME CARE INSTRUCTIONS  °· Drink at least 6-8 glasses of water each day to prevent constipation. °· Try not to strain when you have a bowel movement. °· Keep all follow-up appointments. °If you have had an infection before:  °· Increase the fiber in your diet as directed by your health care provider or dietitian. °· Take a dietary fiber supplement if your health care provider approves. °· Only take medicines as directed by your health care provider. °SEEK MEDICAL CARE IF:  °· You have abdominal pain. °· You have bloating. °· You have cramps. °· You have not gone to the bathroom in 3 days. °SEEK IMMEDIATE MEDICAL CARE IF:  °· Your pain gets worse. °· Your bloating becomes very bad. °· You have a fever or chills, and your symptoms suddenly get worse. °· You begin vomiting. °· You have bowel movements that are bloody or black. °MAKE SURE YOU: °· Understand these instructions. °· Will watch your condition. °· Will get help right away if you are not doing well or get worse. °  °This information is not intended to replace advice given to you by your health care provider. Make sure you discuss any questions you have with your health care provider. °  °Document Released: 11/12/2003 Document Revised: 02/19/2013 Document Reviewed: 01/09/2013 °Elsevier Interactive Patient Education ©2016 [employer] Inc[employer] °Colon Polyps °Polyps are lumps of extra tissue growing inside the body. Polyps can grow in the large intestine (colon). Most colon polyps are noncancerous (benign). However, some colon polyps can become cancerous over time. Polyps that are larger than a pea may be harmful. To be safe, caregivers remove and test all polyps. °CAUSES  °Polyps form when mutations in the genes cause your cells to grow and divide even though no more tissue is needed. °RISK FACTORS °There are a number  of risk factors that can increase your chances of getting colon polyps. They include: °· Being older than 50 years. °· Family history of colon polyps or colon cancer. °· Long-term colon diseases, such as colitis or Crohn disease. °· Being overweight. °· Smoking. °· Being inactive. °· Drinking too much alcohol. °SYMPTOMS  °Most small polyps do not cause symptoms. If symptoms are present, they may include: °· Blood in the stool. The stool may look dark red or black. °· Constipation or diarrhea that lasts longer than 1 week. °DIAGNOSIS °People often do not know they have polyps until their caregiver finds them during a regular checkup. Your caregiver can use 4 tests to check for polyps: °· Digital rectal exam. The caregiver wears gloves and feels inside the rectum. This test would find polyps only in the rectum. °· Barium enema. The caregiver puts a liquid called barium into your rectum before taking X-rays of your colon. Barium makes your colon look white. Polyps are dark, so they are easy to see in the X-ray pictures. °· Sigmoidoscopy. A thin, flexible tube (sigmoidoscope) is placed into your rectum. The sigmoidoscope has a   light and tiny camera in it. The caregiver uses the sigmoidoscope to look at the last third of your colon. °· Colonoscopy. This test is like sigmoidoscopy, but the caregiver looks at the entire colon. This is the most common method for finding and removing polyps. °TREATMENT  °Any polyps will be removed during a sigmoidoscopy or colonoscopy. The polyps are then tested for cancer. °PREVENTION  °To help lower your risk of getting more colon polyps: °· Eat plenty of fruits and vegetables. Avoid eating fatty foods. °· Do not smoke. °· Avoid drinking alcohol. °· Exercise every day. °· Lose weight if recommended by your caregiver. °· Eat plenty of calcium and folate. Foods that are rich in calcium include milk, cheese, and broccoli. Foods that are rich in folate include chickpeas, kidney beans, and  spinach. °HOME CARE INSTRUCTIONS °Keep all follow-up appointments as directed by your caregiver. You may need periodic exams to check for polyps. °SEEK MEDICAL CARE IF: °You notice bleeding during a bowel movement. °  °This information is not intended to replace advice given to you by your health care provider. Make sure you discuss any questions you have with your health care provider. °  °Document Released: 11/11/2003 Document Revised: 03/07/2014 Document Reviewed: 04/26/2011 °Elsevier Interactive Patient Education ©2016 Elsevier Inc. ° °

## 2015-04-09 NOTE — Op Note (Signed)
[facility] [address] [place], [address]   COLONOSCOPY PROCEDURE REPORT  PATIENT: [name], [name]  MR#: [id number] BIRTHDATE: 1945-11-29 , 45  yrs. old GENDER: female ENDOSCOPIST: R.  Garfield Cornea, MD FACP [facility] REFERRED [id number] Woody Seller, M.D. PROCEDURE DATE:  04/23/2015 PROCEDURE:   Colonoscopy with snare polypectomy INDICATIONS:Hemoccult-positive stool; history of colonic adenoma. MEDICATIONS: Versed 6 mg IV and Demerol 75 mg IV in divided doses. Zofran 4 mg IV. ASA CLASS:       Class II  CONSENT: The risks, benefits, alternatives and imponderables including but not limited to bleeding, perforation as well as the possibility of a missed lesion have been reviewed.  The potential for biopsy, lesion removal, etc. have also been discussed. Questions have been answered.  All parties agreeable.  Please see the history and physical in the medical record for more information.  DESCRIPTION OF PROCEDURE:   After the risks benefits and alternatives of the procedure were thoroughly explained, informed consent was obtained.  The digital rectal exam revealed no abnormalities of the rectum.   The [id number]  endoscope was introduced through the anus and advanced to the cecum, which was identified by both the appendix and ileocecal valve. No adverse events experienced.   The quality of the prep was adequate  The instrument was then slowly withdrawn as the colon was fully examined. Estimated blood loss is zero unless otherwise noted in this procedure report.      COLON FINDINGS: Normal-appearing rectal mucosa.  Scattered left-sided diverticula; (1) 8 mm pedunculated polyp in the mid descending segment; otherwise, the remaining colonic mucosa appeared normal.  The above-mentioned polyp was hot snare removed.  Retroflexed views revealed no abnormalities. .  Withdrawal time=8 minutes 0 seconds.  The scope was withdrawn and the procedure  completed. COMPLICATIONS: There were no immediate complications.  ENDOSCOPIC IMPRESSION: Colonic diverticulosis. Colonic polyp?"removed as described above  RECOMMENDATIONS: Follow up on pathology.  eSigned:  R. Garfield Cornea, MD Rosal[facility] 04-23-15 1:45 PM   cc:  CPT CODES: ICD CODES:  The ICD and CPT codes recommended by this software are interpretations from the data that the clinical staff has captured with the software.  The verification of the translation of this report to the ICD and CPT codes and modifiers is the sole responsibility of the health care institution and practicing physician where this report was generated.  [facility]. will not be held responsible for the validity of the ICD and CPT codes included on this report.  AMA assumes no liability for data contained or not contained herein. CPT is a Designer, television/film set of the [employer].  PATIENT NAME:  [name], [name] MR#: [id number]

## 2015-04-09 NOTE — H&P (View-Only) (Signed)
Primary Care Physician:  Glenda Chroman., MD Primary Gastroenterologist:  Dr. Gala Romney  Pre-Procedure History & Physical: HPI:  [name] is a 70 y.o. female here at the request of Dr. Wilber Bihari for being found to be occult blood positive on stool sampling. Patient had normal hemoglobin last month. Has had some intermittent abdominal cramps alternating constipation diarrhea. Has had a hold Amitiza intermittently because of diarrhea. GERD symptoms well controlled on Protonix 40 mg daily. Last colonoscopy about 3 years ago-diverticulosis; history of colonic adenoma before that period; slated to have a surveillance colonoscopy 2019.  Past Medical History  Diagnosis Date  . Hyperlipidemia   . Shortness of breath     with exertion  . Hypothyroidism   . Arthritis   . GERD (gastroesophageal reflux disease)   . IBS (irritable bowel syndrome)   . Heart murmur     MILD, NO CARDIOLOGIST  . Pneumonia   . Depression   . Diverticulitis   . Umbilical hernia   . Primary localized osteoarthritis of right hip 11/11/2014    Past Surgical History  Procedure Laterality Date  . Cesarean section  1967  . Tubal ligation  1970's  . Colonoscopy  2011    Dr. Arnoldo Morale: sigmoid diverticulosis, repeat Aug 2016 due to history of adenomatous polyps  . Esophagogastroduodenoscopy  2011    Dr. Arnoldo Morale: multiple gastric polyps, path with mild chronic gastritis  . Cholecystectomy  01/10/2011    Procedure: LAPAROSCOPIC CHOLECYSTECTOMY;  Surgeon: Scherry Ran;  Location: [facility];  Service: General;  Laterality: N/A;  . Cataract extraction w/phaco Left 05/03/2012    Procedure: CATARACT EXTRACTION PHACO AND INTRAOCULAR LENS PLACEMENT (IOC);  Surgeon: Tonny Branch, MD;  Location: [facility];  Service: Ophthalmology;  Laterality: Left;  CDE: 16.80  . Cataract extraction w/phaco Right 05/21/2012    Procedure: CATARACT EXTRACTION PHACO AND INTRAOCULAR LENS PLACEMENT (IOC);  Surgeon: Tonny Branch, MD;  Location: [facility];  Service:  Ophthalmology;  Laterality: Right;  CDE=12.31  . Colonoscopy N/A 02/04/2013    Dr. Gala Romney :Colonic diverticulosis, repeat in 2019  . Esophagogastroduodenoscopy N/A 02/04/2013    Dr. Cidney Kirkwood:Small hiatal hernia. Gastric polyps s/p bx, negative H.pylori. Benign path.   . Abdominal hysterectomy  2003    OVARIES REMOVED  . Eus N/A 08/29/2013    Dr. Ardis Hughs: no clear cause for acute pancreatitis. Autoimmune labs normal. Question HCTZ as culprit  . Total hip arthroplasty Right 11/11/2014    Procedure: TOTAL RIGHT HIP ARTHROPLASTY;  Surgeon: Marchia Bond, MD;  Location: [facility];  Service: Orthopedics;  Laterality: Right;    Prior to Admission medications   Medication Sig Start Date End Date Taking? Authorizing Provider  levothyroxine (SYNTHROID, LEVOTHROID) 88 MCG tablet Take 88 mcg by mouth daily before breakfast.    Yes Historical Provider, MD  lubiprostone (AMITIZA) 24 MCG capsule Take 1 capsule (24 mcg total) by mouth daily with breakfast. Patient taking differently: Take 24 mcg by mouth daily as needed for constipation.  03/06/14  Yes Orvil Feil, NP  pantoprazole (PROTONIX) 40 MG tablet Take 1 tablet (40 mg total) by mouth daily. 04/09/14  Yes Orvil Feil, NP  polyethylene glycol powder (GLYCOLAX/MIRALAX) powder Take 17 g by mouth at bedtime.   Yes Historical Provider, MD  propranolol (INDERAL) 80 MG tablet Take 80 mg by mouth every evening.    Yes Historical Provider, MD    Allergies as of 03/17/2015 - Review Complete 03/17/2015  Allergen Reaction Noted  . Metoclopramide hcl  Other (See Comments) 01/04/2011  . Aspirin  01/28/2013    Family History  Problem Relation Age of Onset  . Anesthesia problems Neg Hx   . Hypotension Neg Hx   . Malignant hyperthermia Neg Hx   . Pseudochol deficiency Neg Hx   . Colon cancer Neg Hx   . Pancreatitis Neg Hx     Social History   Social History  . Marital Status: Married    Spouse Name: N/A  . Number of Children: N/A  . Years of Education: N/A    Occupational History  . Not on file.   Social History Main Topics  . Smoking status: Never Smoker   . Smokeless tobacco: Never Used  . Alcohol Use: No  . Drug Use: No  . Sexual Activity: Yes    Birth Control/ Protection: None   Other Topics Concern  . Not on file   Social History Narrative    Review of Systems: See HPI, otherwise negative ROS  Physical Exam: BP 108/64 mmHg  Pulse 80  Temp(Src) 97.6 F (36.4 C) (Oral)  Ht 5\' 7"  (1.702 m)  Wt 188 lb 12.8 oz (85.639 kg)  BMI 29.56 kg/m2 General:   Alert, pleasant and cooperative in NAD Skin:  Intact without significant lesions or rashes. Eyes:  Sclera clear, no icterus.   Conjunctiva pink. Ears:  Normal auditory acuity. Nose:  No deformity, discharge,  or lesions. Mouth:  No deformity or lesions. Neck:  Supple; no masses or thyromegaly. No significant cervical adenopathy. Lungs:  Clear throughout to auscultation.   No wheezes, crackles, or rhonchi. No acute distress. Heart:  Regular rate and rhythm; no murmurs, clicks, rubs,  or gallops. Abdomen: Non-distended, normal bowel sounds.  Soft and nontender without appreciable mass or hepatosplenomegaly.  Pulses:  Normal pulses noted. Extremities:  1+ pitting lower tremor edema appear.  Impression:  Very pleasant 70 year old lady with some vague change in bowel habits recently found to be Hemoccult positive. No overt bleeding seen. Hemoglobin normal last month. Although she had a colonoscopy about 3 years ago, it would be prudent to reassess her lower GI tract at this time.  History of colonic adenoma. GERD symptoms well controlled on Protonix - No alarm upper GI tract symptoms  Recommendations: I have offered the patient diagnostic colonoscopy at this time - hemocult positive stool and history of colonic adenoma.  The risks, benefits, limitations, alternatives and imponderables have been reviewed with the patient. Questions have been answered. All parties are agreeable.   Split prep.  Continue Protonix daily. Further recommendations to follow    Notice: This dictation was prepared with Dragon dictation along with smaller phrase technology. Any transcriptional errors that result from this process are unintentional and may not be corrected upon review.

## 2015-04-14 ENCOUNTER — Encounter: Payer: Self-pay | Admitting: Internal Medicine

## 2015-04-15 ENCOUNTER — Encounter (HOSPITAL_COMMUNITY): Payer: Self-pay | Admitting: Internal Medicine

## 2015-05-12 DIAGNOSIS — Z299 Encounter for prophylactic measures, unspecified: Secondary | ICD-10-CM | POA: Diagnosis not present

## 2015-05-12 DIAGNOSIS — I10 Essential (primary) hypertension: Secondary | ICD-10-CM | POA: Diagnosis not present

## 2015-05-12 DIAGNOSIS — Z789 Other specified health status: Secondary | ICD-10-CM | POA: Diagnosis not present

## 2015-05-15 DIAGNOSIS — R109 Unspecified abdominal pain: Secondary | ICD-10-CM | POA: Diagnosis not present

## 2015-05-15 DIAGNOSIS — K449 Diaphragmatic hernia without obstruction or gangrene: Secondary | ICD-10-CM | POA: Diagnosis not present

## 2015-05-15 DIAGNOSIS — R1011 Right upper quadrant pain: Secondary | ICD-10-CM | POA: Diagnosis not present

## 2015-05-15 DIAGNOSIS — R1012 Left upper quadrant pain: Secondary | ICD-10-CM | POA: Diagnosis not present

## 2015-05-15 DIAGNOSIS — K5730 Diverticulosis of large intestine without perforation or abscess without bleeding: Secondary | ICD-10-CM | POA: Diagnosis not present

## 2015-05-15 DIAGNOSIS — K429 Umbilical hernia without obstruction or gangrene: Secondary | ICD-10-CM | POA: Diagnosis not present

## 2015-05-20 DIAGNOSIS — H5712 Ocular pain, left eye: Secondary | ICD-10-CM | POA: Diagnosis not present

## 2015-05-20 DIAGNOSIS — H1132 Conjunctival hemorrhage, left eye: Secondary | ICD-10-CM | POA: Diagnosis not present

## 2015-07-21 DIAGNOSIS — L570 Actinic keratosis: Secondary | ICD-10-CM | POA: Diagnosis not present

## 2015-07-21 DIAGNOSIS — C44321 Squamous cell carcinoma of skin of nose: Secondary | ICD-10-CM | POA: Diagnosis not present

## 2015-07-21 DIAGNOSIS — X32XXXD Exposure to sunlight, subsequent encounter: Secondary | ICD-10-CM | POA: Diagnosis not present

## 2015-08-13 DIAGNOSIS — I10 Essential (primary) hypertension: Secondary | ICD-10-CM | POA: Diagnosis not present

## 2015-09-02 DIAGNOSIS — Z08 Encounter for follow-up examination after completed treatment for malignant neoplasm: Secondary | ICD-10-CM | POA: Diagnosis not present

## 2015-09-02 DIAGNOSIS — Z85828 Personal history of other malignant neoplasm of skin: Secondary | ICD-10-CM | POA: Diagnosis not present

## 2015-09-02 DIAGNOSIS — L218 Other seborrheic dermatitis: Secondary | ICD-10-CM | POA: Diagnosis not present

## 2015-09-22 ENCOUNTER — Other Ambulatory Visit: Payer: Self-pay | Admitting: Gastroenterology

## 2015-10-26 ENCOUNTER — Other Ambulatory Visit: Payer: Self-pay | Admitting: Gastroenterology

## 2015-11-25 ENCOUNTER — Ambulatory Visit: Payer: Medicare Other | Admitting: Gastroenterology

## 2015-12-31 DIAGNOSIS — Z23 Encounter for immunization: Secondary | ICD-10-CM | POA: Diagnosis not present

## 2016-01-28 ENCOUNTER — Telehealth: Payer: Self-pay | Admitting: Internal Medicine

## 2016-01-28 NOTE — Telephone Encounter (Signed)
Pt called back at 0810 and spoke to [name] to verify her OV. [name] told her that she wasn't scheduled an OV because she said she wanted to speak with the nurse first before scheduling. Pt decided to take OV with RMR on 12/5 at 4pm

## 2016-01-28 NOTE — Telephone Encounter (Signed)
Pt called this morning at [facility] saying she needed to be seen by the doctor asap. I offered her OV with RMR for Tuesday at 4. She said she has gone to the bathroom twice this morning and her stools are lime green. She wanted to speak with the nurse first before scheduling appointment. I transferred call to VM

## 2016-02-02 ENCOUNTER — Ambulatory Visit (INDEPENDENT_AMBULATORY_CARE_PROVIDER_SITE_OTHER): Payer: Medicare Other | Admitting: Internal Medicine

## 2016-02-02 ENCOUNTER — Encounter: Payer: Self-pay | Admitting: Internal Medicine

## 2016-02-02 VITALS — BP 130/74 | HR 78 | Temp 98.1°F | Ht 67.0 in | Wt 186.0 lb

## 2016-02-02 DIAGNOSIS — K5909 Other constipation: Secondary | ICD-10-CM

## 2016-02-02 DIAGNOSIS — K219 Gastro-esophageal reflux disease without esophagitis: Secondary | ICD-10-CM

## 2016-02-02 NOTE — Patient Instructions (Signed)
Take Amitiza 24 microgram gel cap once a day - everyday during breakfast  Take a serving of yogurt daily  Continue Protonix 40 mg daily  Office visit with [place] in 4 months

## 2016-02-02 NOTE — Progress Notes (Signed)
Primary Care Physician:  Glenda Chroman, MD Primary Gastroenterologist:  Dr. Gala Romney  Pre-Procedure History & Physical: HPI:  [name] is a 70 y.o. female here for concern of 2 "lime green stools" last week. Nothing in her diet might return her stool screen. Tendency towards chronic constipation. Has had any black or red stools. No associated abdominal pain. GERD symptoms well controlled on once a day Protonix.  Colonic adenoma removed in February of this year; due for possible surveillance exam in 5 years if benefits outweigh the risks at that time.  Past Medical History:  Diagnosis Date  . Arthritis   . Depression   . Diverticulitis   . GERD (gastroesophageal reflux disease)   . Heart murmur    MILD, NO CARDIOLOGIST  . Hyperlipidemia   . Hypothyroidism   . IBS (irritable bowel syndrome)   . Pneumonia   . Primary localized osteoarthritis of right hip 11/11/2014  . Shortness of breath    with exertion  . Umbilical hernia     Past Surgical History:  Procedure Laterality Date  . ABDOMINAL HYSTERECTOMY  2003   OVARIES REMOVED  . CATARACT EXTRACTION W/PHACO Left 05/03/2012   Procedure: CATARACT EXTRACTION PHACO AND INTRAOCULAR LENS PLACEMENT (IOC);  Surgeon: Tonny Branch, MD;  Location: [facility];  Service: Ophthalmology;  Laterality: Left;  CDE: 16.80  . CATARACT EXTRACTION W/PHACO Right 05/21/2012   Procedure: CATARACT EXTRACTION PHACO AND INTRAOCULAR LENS PLACEMENT (IOC);  Surgeon: Tonny Branch, MD;  Location: [facility];  Service: Ophthalmology;  Laterality: Right;  CDE=12.31  . [place]  . CHOLECYSTECTOMY  01/10/2011   Procedure: LAPAROSCOPIC CHOLECYSTECTOMY;  Surgeon: Scherry Ran;  Location: [facility];  Service: General;  Laterality: N/A;  . COLONOSCOPY  2011   Dr. Arnoldo Morale: sigmoid diverticulosis, repeat Aug 2016 due to history of adenomatous polyps  . COLONOSCOPY N/A 02/04/2013   Dr. Gala Romney :Colonic diverticulosis, repeat in 2019  . COLONOSCOPY N/A 04/09/2015   Procedure: COLONOSCOPY;  Surgeon: Daneil Dolin, MD;  [facility]: [facility];  Service: Endoscopy;  Laterality: N/A;  1100 - moved to 12:45 - office to notify   . ESOPHAGOGASTRODUODENOSCOPY  2011   Dr. Arnoldo Morale: multiple gastric polyps, path with mild chronic gastritis  . ESOPHAGOGASTRODUODENOSCOPY N/A 02/04/2013   Dr. Gargi Berch:Small hiatal hernia. Gastric polyps s/p bx, negative H.pylori. Benign path.   . EUS N/A 08/29/2013   Dr. Ardis Hughs: no clear cause for acute pancreatitis. Autoimmune labs normal. Question HCTZ as culprit  . TOTAL HIP ARTHROPLASTY Right 11/11/2014   Procedure: TOTAL RIGHT HIP ARTHROPLASTY;  Surgeon: Marchia Bond, MD;  Location: [facility];  Service: Orthopedics;  Laterality: Right;  . TUBAL LIGATION  1970's    Prior to Admission medications   Medication Sig Start Date End Date Taking? Authorizing Provider  acetaminophen (TYLENOL) 650 MG CR tablet Take 1,300 mg by mouth every 8 (eight) hours as needed for pain.   Yes Historical Provider, MD  AMITIZA 24 MCG capsule TAKE ONE CAPSULE BY MOUTH ONCE DAILY WITH BREAKFAST Patient taking differently: TAKE ONE CAPSULE BY MOUTH ONCE DAILY WITH BREAKFAST. Takes as needed. 10/28/15  Yes Carlis Stable, NP  levothyroxine (SYNTHROID, LEVOTHROID) 88 MCG tablet Take 88 mcg by mouth daily before breakfast.    Yes Historical Provider, MD  loperamide (IMODIUM) 2 MG capsule Take 2 mg by mouth as needed for diarrhea or loose stools.   Yes Historical Provider, MD  pantoprazole (PROTONIX) 40 MG tablet TAKE ONE TABLET BY  MOUTH ONCE DAILY 09/23/15  Yes Annitta Needs, NP  polyethylene glycol powder (GLYCOLAX/MIRALAX) powder Take 17 g by mouth as needed.    Yes Historical Provider, MD  propranolol (INDERAL) 80 MG tablet Take 80 mg by mouth every evening.    Yes Historical Provider, MD  polyethylene glycol-electrolytes (TRILYTE) 420 g solution Take 4,000 mLs by mouth as directed. Patient not taking: Reported on 02/02/2016 03/17/15   Daneil Dolin, MD    Allergies  as of 02/02/2016 - Review Complete 02/02/2016  Allergen Reaction Noted  . Metoclopramide hcl Other (See Comments) 01/04/2011  . Aspirin  01/28/2013  . Codeine Nausea Only 04/09/2015    Family History  Problem Relation Age of Onset  . Anesthesia problems Neg Hx   . Hypotension Neg Hx   . Malignant hyperthermia Neg Hx   . Pseudochol deficiency Neg Hx   . Colon cancer Neg Hx   . Pancreatitis Neg Hx     Social History   Social History  . Marital status: Married    Spouse name: N/A  . Number of children: N/A  . Years of education: N/A   Occupational History  . Not on file.   Social History Main Topics  . Smoking status: Never Smoker  . Smokeless tobacco: Never Used  . Alcohol use No  . Drug use: No  . Sexual activity: Yes    Birth control/ protection: None   Other Topics Concern  . Not on file   Social History Narrative  . No narrative on file    Review of Systems: See HPI, otherwise negative ROS  Physical Exam: BP 130/74   Pulse 78   Temp 98.1 F (36.7 C) (Oral)   Ht 5\' 7"  (1.702 m)   Wt 186 lb (84.4 kg)   BMI 29.13 kg/m  General:   Alert,  Well-developed, well-nourished, pleasant and cooperative in NAD Skin:  Intact without significant lesions or rashes. Eyes:  Sclera clear, no icterus.   Conjunctiva pink. Neck:  Supple; no masses or thyromegaly. No significant cervical adenopathy. Lungs:  Clear throughout to auscultation.   No wheezes, crackles, or rhonchi. No acute distress. Heart:  Regular rate and rhythm; no murmurs, clicks, rubs,  or gallops. Abdomen: Non-distended, normal bowel sounds.  Soft and nontender without appreciable mass or hepatosplenomegaly.  Pulses:  Normal pulses noted. Extremities:  Without clubbing or edema.  Impression: 70 year old lady with change in stool color - self-limiting. Likely due to combination of nutrients ingested. Likely not clinically significant  Constipation continues to be an ongoing issue. GERD symptoms well  controlled Protonix.   Recommendations:  Take Amitiza 24 microgram gel cap once a day - everyday during breakfast  Take a serving of yogurt daily  Continue Protonix 40 mg daily  Office visit with [place] in 4 months   Notice: This dictation was prepared with Dragon dictation along with smaller phrase technology. Any transcriptional errors that result from this process are unintentional and may not be corrected upon review.

## 2016-02-11 ENCOUNTER — Telehealth: Payer: Self-pay | Admitting: Internal Medicine

## 2016-02-11 NOTE — Telephone Encounter (Signed)
Pt called to say that she was seen recently and she isn't feeling any better. Please call her back at [phone]

## 2016-02-12 DIAGNOSIS — Z6831 Body mass index (BMI) 31.0-31.9, adult: Secondary | ICD-10-CM | POA: Diagnosis not present

## 2016-02-12 DIAGNOSIS — Z7189 Other specified counseling: Secondary | ICD-10-CM | POA: Diagnosis not present

## 2016-02-12 DIAGNOSIS — Z299 Encounter for prophylactic measures, unspecified: Secondary | ICD-10-CM | POA: Diagnosis not present

## 2016-02-12 DIAGNOSIS — R5383 Other fatigue: Secondary | ICD-10-CM | POA: Diagnosis not present

## 2016-02-12 DIAGNOSIS — Z0000 Encounter for general adult medical examination without abnormal findings: Secondary | ICD-10-CM | POA: Diagnosis not present

## 2016-02-12 DIAGNOSIS — Z1389 Encounter for screening for other disorder: Secondary | ICD-10-CM | POA: Diagnosis not present

## 2016-02-12 DIAGNOSIS — Z1211 Encounter for screening for malignant neoplasm of colon: Secondary | ICD-10-CM | POA: Diagnosis not present

## 2016-02-12 DIAGNOSIS — Z79899 Other long term (current) drug therapy: Secondary | ICD-10-CM | POA: Diagnosis not present

## 2016-02-12 DIAGNOSIS — E039 Hypothyroidism, unspecified: Secondary | ICD-10-CM | POA: Diagnosis not present

## 2016-02-12 DIAGNOSIS — E7800 Pure hypercholesterolemia, unspecified: Secondary | ICD-10-CM | POA: Diagnosis not present

## 2016-02-15 NOTE — Telephone Encounter (Signed)
Spoke with the pt- she said she was constipated on Thursday- she used an enema and a suppository. On Friday and Saturday she had diarrhea 2 x each day. Pt is taking amitiza 62mcg once a day, she is taking a probiotic and eating activia yogurt everyday. Pt has not had a bm since Saturday. She is wondering if there is anything else she can do to get her bowels regular?

## 2016-02-15 NOTE — Telephone Encounter (Signed)
Could she try increasing to BID, or has she done that regimen before? Would try that first. If that is too strong, then back down to 24 mcg once daily and add Miralax prn.

## 2016-02-16 NOTE — Telephone Encounter (Signed)
Pt said she has tried bid dosing and it was too strong and caused diarrhea. She is currently taking miralax in the evenings now. She didn't tell me this before.  Should we try something else?

## 2016-02-16 NOTE — Telephone Encounter (Signed)
Why not the 50mcg BID?

## 2016-02-17 NOTE — Telephone Encounter (Signed)
Perfect

## 2016-02-17 NOTE — Telephone Encounter (Signed)
Pt has not tried amitiza 69mcg. I have left her samples at the front desk with instructions on how to take them. Pt said she will pick them up tomorrow.

## 2016-03-01 ENCOUNTER — Other Ambulatory Visit: Payer: Self-pay

## 2016-03-01 MED ORDER — LUBIPROSTONE 8 MCG PO CAPS: 8.0000 ug | ORAL_CAPSULE | Freq: Two times a day (BID) | ORAL | 5 refills | Status: DC

## 2016-03-18 DIAGNOSIS — H35371 Puckering of macula, right eye: Secondary | ICD-10-CM | POA: Diagnosis not present

## 2016-03-18 DIAGNOSIS — H524 Presbyopia: Secondary | ICD-10-CM | POA: Diagnosis not present

## 2016-03-18 DIAGNOSIS — Z961 Presence of intraocular lens: Secondary | ICD-10-CM | POA: Diagnosis not present

## 2016-05-06 DIAGNOSIS — J320 Chronic maxillary sinusitis: Secondary | ICD-10-CM | POA: Diagnosis not present

## 2016-05-06 DIAGNOSIS — Z6830 Body mass index (BMI) 30.0-30.9, adult: Secondary | ICD-10-CM | POA: Diagnosis not present

## 2016-05-06 DIAGNOSIS — Z299 Encounter for prophylactic measures, unspecified: Secondary | ICD-10-CM | POA: Diagnosis not present

## 2016-05-06 DIAGNOSIS — R51 Headache: Secondary | ICD-10-CM | POA: Diagnosis not present

## 2016-05-06 DIAGNOSIS — Z713 Dietary counseling and surveillance: Secondary | ICD-10-CM | POA: Diagnosis not present

## 2016-05-20 DIAGNOSIS — J329 Chronic sinusitis, unspecified: Secondary | ICD-10-CM | POA: Diagnosis not present

## 2016-05-20 DIAGNOSIS — I10 Essential (primary) hypertension: Secondary | ICD-10-CM | POA: Diagnosis not present

## 2016-05-20 DIAGNOSIS — Z6830 Body mass index (BMI) 30.0-30.9, adult: Secondary | ICD-10-CM | POA: Diagnosis not present

## 2016-05-20 DIAGNOSIS — Z299 Encounter for prophylactic measures, unspecified: Secondary | ICD-10-CM | POA: Diagnosis not present

## 2016-05-20 DIAGNOSIS — R51 Headache: Secondary | ICD-10-CM | POA: Diagnosis not present

## 2016-05-20 DIAGNOSIS — Z789 Other specified health status: Secondary | ICD-10-CM | POA: Diagnosis not present

## 2016-05-20 DIAGNOSIS — Z713 Dietary counseling and surveillance: Secondary | ICD-10-CM | POA: Diagnosis not present

## 2016-05-24 DIAGNOSIS — G8929 Other chronic pain: Secondary | ICD-10-CM | POA: Diagnosis not present

## 2016-05-24 DIAGNOSIS — J019 Acute sinusitis, unspecified: Secondary | ICD-10-CM | POA: Diagnosis not present

## 2016-05-24 DIAGNOSIS — R42 Dizziness and giddiness: Secondary | ICD-10-CM | POA: Diagnosis not present

## 2016-05-24 DIAGNOSIS — R51 Headache: Secondary | ICD-10-CM | POA: Diagnosis not present

## 2016-06-06 ENCOUNTER — Ambulatory Visit (INDEPENDENT_AMBULATORY_CARE_PROVIDER_SITE_OTHER): Payer: Medicare Other | Admitting: Gastroenterology

## 2016-06-06 ENCOUNTER — Encounter: Payer: Self-pay | Admitting: Gastroenterology

## 2016-06-06 VITALS — BP 107/67 | HR 56 | Temp 97.7°F | Ht 67.0 in | Wt 181.6 lb

## 2016-06-06 DIAGNOSIS — K581 Irritable bowel syndrome with constipation: Secondary | ICD-10-CM | POA: Diagnosis not present

## 2016-06-06 DIAGNOSIS — K219 Gastro-esophageal reflux disease without esophagitis: Secondary | ICD-10-CM

## 2016-06-06 MED ORDER — PANTOPRAZOLE SODIUM 40 MG PO TBEC: 40.0000 mg | DELAYED_RELEASE_TABLET | Freq: Every day | ORAL | 3 refills | Status: DC

## 2016-06-06 NOTE — Patient Instructions (Signed)
Continue Protonix once each morning.   I have given you samples of Trulance to take once each morning for constipation. Let me know how this works. IF it doesn't, then go back to Amitiza 24 mcg once each day with food. You could take this every other day if needed. We will find what works best for you.  Return in 6 months.

## 2016-06-06 NOTE — Assessment & Plan Note (Signed)
Constipation predominant. Straining with Amitiza 8 mcg BID, and Amitiza 24 mcg daily seems to be too strong. Will trial Trulance (has already tried Linzess historically). If this is not helpful, will go back to novel dosing of Amitiza 24 mcg daily to every other day. Return in 6 months and next colonoscopy in 2022.

## 2016-06-06 NOTE — Progress Notes (Signed)
Referring Provider: Glenda Chroman, MD Primary Care Physician:  Glenda Chroman, MD  Primary GI: Dr. Gala Romney    Chief Complaint  Patient presents with  . Constipation  . Diarrhea    had diarrhea this morning    HPI:   [name] is a 71 y.o. female presenting today with a history of chronic constipation and GERD, here for routine follow-up.   Had diarrhea this morning. Took Amitiza this morning. Taking Amitiza 8 mcg BID.  Still has to strain. No rectal bleeding. Occasional upper abdominal cramping usually noticed when constipated. This morning was "griping" a lot. Last night ate a hamburger and some potato salad at a cookout. Protonix once daily. Has to sometimes use a suppository to have a BM. Next colonoscopy in 2022.   Past Medical History:  Diagnosis Date  . Arthritis   . Depression   . Diverticulitis   . GERD (gastroesophageal reflux disease)   . Heart murmur    MILD, NO CARDIOLOGIST  . Hyperlipidemia   . Hypothyroidism   . IBS (irritable bowel syndrome)   . Pneumonia   . Primary localized osteoarthritis of right hip 11/11/2014  . Shortness of breath    with exertion  . Umbilical hernia     Past Surgical History:  Procedure Laterality Date  . ABDOMINAL HYSTERECTOMY  2003   OVARIES REMOVED  . CATARACT EXTRACTION W/PHACO Left 05/03/2012   Procedure: CATARACT EXTRACTION PHACO AND INTRAOCULAR LENS PLACEMENT (IOC);  Surgeon: Tonny Branch, MD;  Location: [facility];  Service: Ophthalmology;  Laterality: Left;  CDE: 16.80  . CATARACT EXTRACTION W/PHACO Right 05/21/2012   Procedure: CATARACT EXTRACTION PHACO AND INTRAOCULAR LENS PLACEMENT (IOC);  Surgeon: Tonny Branch, MD;  Location: [facility];  Service: Ophthalmology;  Laterality: Right;  CDE=12.31  . [place]  . CHOLECYSTECTOMY  01/10/2011   Procedure: LAPAROSCOPIC CHOLECYSTECTOMY;  Surgeon: Scherry Ran;  Location: [facility];  Service: General;  Laterality: N/A;  . COLONOSCOPY  2011   Dr. Arnoldo Morale: sigmoid  diverticulosis, repeat Aug 2016 due to history of adenomatous polyps  . COLONOSCOPY N/A 02/04/2013   Dr. Gala Romney :Colonic diverticulosis, repeat in 2019  . COLONOSCOPY N/A 04/09/2015   Dr. Gala Romney: tubular adenoma and diverticulosis. Surveillance in 5 years   . ESOPHAGOGASTRODUODENOSCOPY  2011   Dr. Arnoldo Morale: multiple gastric polyps, path with mild chronic gastritis  . ESOPHAGOGASTRODUODENOSCOPY N/A 02/04/2013   Dr. Rourk:Small hiatal hernia. Gastric polyps s/p bx, negative H.pylori. Benign path.   . EUS N/A 08/29/2013   Dr. Ardis Hughs: no clear cause for acute pancreatitis. Autoimmune labs normal. Question HCTZ as culprit  . 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 TUBAL LIGATION  1970's    Current Outpatient Prescriptions  Medication Sig Dispense Refill  . acetaminophen (TYLENOL) 650 MG CR tablet Take 1,300 mg by mouth every 8 (eight) hours as needed for pain.    Marland Kitchen ibuprofen (ADVIL,MOTRIN) 200 MG tablet Take 400 mg by mouth every 6 (six) hours as needed.    Marland Kitchen levothyroxine (SYNTHROID, LEVOTHROID) 88 MCG tablet Take 88 mcg by mouth daily before breakfast.     . loperamide (IMODIUM) 2 MG capsule Take 2 mg by mouth as needed for diarrhea or loose stools.    . lubiprostone (AMITIZA) 8 MCG capsule Take 1 capsule (8 mcg total) by mouth 2 (two) times daily with  a meal. 60 capsule 5  . pantoprazole (PROTONIX) 40 MG tablet Take 1 tablet (40 mg total) by mouth daily. 90 tablet 3  . polyethylene glycol powder (GLYCOLAX/MIRALAX) powder Take 17 g by mouth as needed.     . propranolol (INDERAL) 80 MG tablet Take 80 mg by mouth every evening.      No current facility-administered medications for this visit.     Allergies as of 06/06/2016 - Review Complete 06/06/2016  Allergen Reaction Noted  . Metoclopramide hcl Other (See Comments) 01/04/2011  . Aspirin  01/28/2013  . Codeine  Nausea Only 04/09/2015    Family History  Problem Relation Age of Onset  . Anesthesia problems Neg Hx   . Hypotension Neg Hx   . Malignant hyperthermia Neg Hx   . Pseudochol deficiency Neg Hx   . Colon cancer Neg Hx   . Pancreatitis Neg Hx     Social History   Social History  . Marital status: Married    Spouse name: N/A  . Number of children: N/A  . Years of education: N/A   Social History Main Topics  . Smoking status: Never Smoker  . Smokeless tobacco: Never Used  . Alcohol use No  . Drug use: No  . Sexual activity: Yes    Birth control/ protection: None   Other Topics Concern  . None   Social History Narrative  . None    Review of Systems: As mentioned in HPI   Physical Exam: BP 107/67   Pulse (!) 56   Temp 97.7 F (36.5 C) (Oral)   Ht 5\' 7"  (1.702 m)   Wt 181 lb 9.6 oz (82.4 kg)   BMI 28.44 kg/m  General:   Alert and oriented. No distress noted. Pleasant and cooperative.  Head:  Normocephalic and atraumatic. Eyes:  Conjuctiva clear without scleral icterus. Mouth:  Oral mucosa pink and moist.  Abdomen:  +BS, soft, mild TTP upper abdomen and non-distended. No rebound or guarding. No HSM or masses noted. Msk:  Symmetrical without gross deformities. Normal posture. Extremities:  Without edema. Neurologic:  Alert and  oriented x4 Psych:  Alert and cooperative. Normal mood and affect.

## 2016-06-06 NOTE — Progress Notes (Signed)
CC'D TO PCP °

## 2016-06-06 NOTE — Assessment & Plan Note (Signed)
Continue Protonix once each morning. Refills provided. Return in 6 months.

## 2016-06-14 ENCOUNTER — Telehealth: Payer: Self-pay | Admitting: Internal Medicine

## 2016-06-14 MED ORDER — PLECANATIDE 3 MG PO TABS: 1.0000 | ORAL_TABLET | Freq: Every day | ORAL | 3 refills | Status: DC

## 2016-06-14 NOTE — Addendum Note (Signed)
Addended by: Annitta Needs on: 06/14/2016 10:35 AM   Modules accepted: Orders

## 2016-06-14 NOTE — Telephone Encounter (Signed)
Routing to Anna

## 2016-06-14 NOTE — Telephone Encounter (Signed)
Patient called and stated that trulance is working and would like a prescription called into [facility] in [place]   [phone]

## 2016-06-14 NOTE — Telephone Encounter (Signed)
Done

## 2016-08-11 DIAGNOSIS — Z6830 Body mass index (BMI) 30.0-30.9, adult: Secondary | ICD-10-CM | POA: Diagnosis not present

## 2016-08-11 DIAGNOSIS — J320 Chronic maxillary sinusitis: Secondary | ICD-10-CM | POA: Diagnosis not present

## 2016-08-11 DIAGNOSIS — I10 Essential (primary) hypertension: Secondary | ICD-10-CM | POA: Diagnosis not present

## 2016-08-11 DIAGNOSIS — Z299 Encounter for prophylactic measures, unspecified: Secondary | ICD-10-CM | POA: Diagnosis not present

## 2016-08-11 DIAGNOSIS — I739 Peripheral vascular disease, unspecified: Secondary | ICD-10-CM | POA: Diagnosis not present

## 2016-08-11 DIAGNOSIS — E039 Hypothyroidism, unspecified: Secondary | ICD-10-CM | POA: Diagnosis not present

## 2016-12-07 ENCOUNTER — Ambulatory Visit: Payer: Medicare Other | Admitting: Gastroenterology

## 2016-12-13 DIAGNOSIS — Z299 Encounter for prophylactic measures, unspecified: Secondary | ICD-10-CM | POA: Diagnosis not present

## 2016-12-13 DIAGNOSIS — I10 Essential (primary) hypertension: Secondary | ICD-10-CM | POA: Diagnosis not present

## 2016-12-13 DIAGNOSIS — Z6830 Body mass index (BMI) 30.0-30.9, adult: Secondary | ICD-10-CM | POA: Diagnosis not present

## 2016-12-13 DIAGNOSIS — H1132 Conjunctival hemorrhage, left eye: Secondary | ICD-10-CM | POA: Diagnosis not present

## 2016-12-13 DIAGNOSIS — Z23 Encounter for immunization: Secondary | ICD-10-CM | POA: Diagnosis not present

## 2016-12-16 DIAGNOSIS — H26493 Other secondary cataract, bilateral: Secondary | ICD-10-CM | POA: Diagnosis not present

## 2016-12-16 DIAGNOSIS — Z961 Presence of intraocular lens: Secondary | ICD-10-CM | POA: Diagnosis not present

## 2016-12-16 DIAGNOSIS — H35371 Puckering of macula, right eye: Secondary | ICD-10-CM | POA: Diagnosis not present

## 2017-01-12 ENCOUNTER — Ambulatory Visit (INDEPENDENT_AMBULATORY_CARE_PROVIDER_SITE_OTHER): Payer: Medicare Other | Admitting: Gastroenterology

## 2017-01-12 ENCOUNTER — Encounter: Payer: Self-pay | Admitting: Gastroenterology

## 2017-01-12 VITALS — BP 121/62 | HR 57 | Temp 97.4°F | Ht 67.0 in | Wt 181.6 lb

## 2017-01-12 DIAGNOSIS — K219 Gastro-esophageal reflux disease without esophagitis: Secondary | ICD-10-CM | POA: Diagnosis not present

## 2017-01-12 DIAGNOSIS — K581 Irritable bowel syndrome with constipation: Secondary | ICD-10-CM

## 2017-01-12 NOTE — Patient Instructions (Addendum)
1. Continue Trulance 3 mg daily for constipation. 2. Continue pantoprazole 40 mg daily for reflux. 3. We will see back in 1 year, call sooner if needed.

## 2017-01-12 NOTE — Progress Notes (Signed)
cc'ed to pcp °

## 2017-01-12 NOTE — Progress Notes (Signed)
      Primary Care Physician: Glenda Chroman, MD  Primary Gastroenterologist:  Garfield Cornea, MD   Chief Complaint  Patient presents with  . Constipation  . Diarrhea    HPI: [name] is a 71 y.o. female here for follow-up.  Last seen in April.  History of chronic constipation and GERD.  Last colonoscopy 2017 with tubular adenoma, diverticulosis.  Surveillance exam in 2022.  Previously Amitiza 24 mcg daily was too strong, Amitiza 8 mcg twice daily not effective.  Doing much better on Trulance 3 mg daily.  No longer on generally has 1-2 soft to loose BMs daily with occasional hard stool.  Abdominal griping improves after BM.  Pleased with her response to Trulance. Still with occasional postprandial loose stool when eating out.  Regular bright red blood on the toilet tissue when straining.  Goes for a physical next month.  Upper GI symptoms well controlled on Protonix.  No dysphasia.  Appetite is good.  Weight is stable.    Current Outpatient Medications  Medication Sig Dispense Refill  . acetaminophen (TYLENOL) 650 MG CR tablet Take 1,300 mg by mouth every 8 (eight) hours as needed for pain.    . Diphenhydramine-APAP, sleep, (UNISOM PM PAIN PO) Take at bedtime by mouth.    Marland Kitchen ibuprofen (ADVIL,MOTRIN) 200 MG tablet Take 400 mg by mouth every 6 (six) hours as needed.    Marland Kitchen levothyroxine (SYNTHROID, LEVOTHROID) 88 MCG tablet Take 88 mcg by mouth daily before breakfast.     . loperamide (IMODIUM) 2 MG capsule Take 2 mg by mouth as needed for diarrhea or loose stools.    . pantoprazole (PROTONIX) 40 MG tablet Take 1 tablet (40 mg total) by mouth daily. 90 tablet 3  . Plecanatide (TRULANCE) 3 MG TABS Take 1 tablet by mouth daily. 90 tablet 3  . propranolol (INDERAL) 80 MG tablet Take 80 mg by mouth every evening.      No current facility-administered medications for this visit.     Allergies as of 01/12/2017 - Review Complete 01/12/2017  Allergen Reaction Noted  . Metoclopramide  hcl Other (See Comments) 01/04/2011  . Aspirin  01/28/2013  . Codeine Nausea Only 04/09/2015    ROS:  General: Negative for anorexia, weight loss, fever, chills, fatigue, weakness. ENT: Negative for hoarseness, difficulty swallowing , nasal congestion. CV: Negative for chest pain, angina, palpitations, dyspnea on exertion, peripheral edema.  Respiratory: Negative for dyspnea at rest, dyspnea on exertion, cough, sputum, wheezing.  GI: See history of present illness. GU:  Negative for dysuria, hematuria, urinary incontinence, urinary frequency, nocturnal urination.  Endo: Negative for unusual weight change.    Physical Examination:   BP 121/62   Pulse (!) 57   Temp (!) 97.4 F (36.3 C) (Oral)   Ht 5\' 7"  (1.702 m)   Wt 181 lb 9.6 oz (82.4 kg)   BMI 28.44 kg/m   General: Well-nourished, well-developed in no acute distress.  Eyes: No icterus. Mouth: Oropharyngeal mucosa moist and pink , no lesions erythema or exudate. Lungs: Clear to auscultation bilaterally.  Heart: Regular rate and rhythm, no murmurs rubs or gallops.  Abdomen: Bowel sounds are normal, nontender, nondistended, no hepatosplenomegaly or masses, no abdominal bruits or hernia , no rebound or guarding.   Extremities: No lower extremity edema. No clubbing or deformities. Neuro: Alert and oriented x 4   Skin: Warm and dry, no jaundice.   Psych: Alert and cooperative, normal mood and affect.

## 2017-01-12 NOTE — Assessment & Plan Note (Signed)
Constipation predominant.  Doing well on current regimen, Trulance 3mg  daily.  Return to the office in 1 year or sooner if needed.

## 2017-01-12 NOTE — Assessment & Plan Note (Signed)
Continue Protonix once daily.  Return to the office in 1 year or sooner if needed

## 2017-02-07 DIAGNOSIS — Z299 Encounter for prophylactic measures, unspecified: Secondary | ICD-10-CM | POA: Diagnosis not present

## 2017-02-07 DIAGNOSIS — Z713 Dietary counseling and surveillance: Secondary | ICD-10-CM | POA: Diagnosis not present

## 2017-02-07 DIAGNOSIS — N390 Urinary tract infection, site not specified: Secondary | ICD-10-CM | POA: Diagnosis not present

## 2017-02-07 DIAGNOSIS — R3911 Hesitancy of micturition: Secondary | ICD-10-CM | POA: Diagnosis not present

## 2017-02-07 DIAGNOSIS — Z6830 Body mass index (BMI) 30.0-30.9, adult: Secondary | ICD-10-CM | POA: Diagnosis not present

## 2017-02-17 DIAGNOSIS — E7800 Pure hypercholesterolemia, unspecified: Secondary | ICD-10-CM | POA: Diagnosis not present

## 2017-02-17 DIAGNOSIS — Z1339 Encounter for screening examination for other mental health and behavioral disorders: Secondary | ICD-10-CM | POA: Diagnosis not present

## 2017-02-17 DIAGNOSIS — Z6831 Body mass index (BMI) 31.0-31.9, adult: Secondary | ICD-10-CM | POA: Diagnosis not present

## 2017-02-17 DIAGNOSIS — Z1331 Encounter for screening for depression: Secondary | ICD-10-CM | POA: Diagnosis not present

## 2017-02-17 DIAGNOSIS — Z299 Encounter for prophylactic measures, unspecified: Secondary | ICD-10-CM | POA: Diagnosis not present

## 2017-02-17 DIAGNOSIS — E039 Hypothyroidism, unspecified: Secondary | ICD-10-CM | POA: Diagnosis not present

## 2017-02-17 DIAGNOSIS — E8940 Asymptomatic postprocedural ovarian failure: Secondary | ICD-10-CM | POA: Diagnosis not present

## 2017-02-17 DIAGNOSIS — Z7189 Other specified counseling: Secondary | ICD-10-CM | POA: Diagnosis not present

## 2017-02-17 DIAGNOSIS — Z0000 Encounter for general adult medical examination without abnormal findings: Secondary | ICD-10-CM | POA: Diagnosis not present

## 2017-02-17 DIAGNOSIS — Z79899 Other long term (current) drug therapy: Secondary | ICD-10-CM | POA: Diagnosis not present

## 2017-02-17 DIAGNOSIS — R5383 Other fatigue: Secondary | ICD-10-CM | POA: Diagnosis not present

## 2017-02-17 DIAGNOSIS — Z1211 Encounter for screening for malignant neoplasm of colon: Secondary | ICD-10-CM | POA: Diagnosis not present

## 2017-02-27 DIAGNOSIS — H26492 Other secondary cataract, left eye: Secondary | ICD-10-CM | POA: Diagnosis not present

## 2017-02-27 DIAGNOSIS — H26491 Other secondary cataract, right eye: Secondary | ICD-10-CM | POA: Diagnosis not present

## 2017-03-21 DIAGNOSIS — H26491 Other secondary cataract, right eye: Secondary | ICD-10-CM | POA: Diagnosis not present

## 2017-03-21 DIAGNOSIS — H26492 Other secondary cataract, left eye: Secondary | ICD-10-CM | POA: Diagnosis not present

## 2017-03-21 DIAGNOSIS — E2839 Other primary ovarian failure: Secondary | ICD-10-CM | POA: Diagnosis not present

## 2017-04-04 DIAGNOSIS — Z299 Encounter for prophylactic measures, unspecified: Secondary | ICD-10-CM | POA: Diagnosis not present

## 2017-04-04 DIAGNOSIS — I10 Essential (primary) hypertension: Secondary | ICD-10-CM | POA: Diagnosis not present

## 2017-04-04 DIAGNOSIS — Z6831 Body mass index (BMI) 31.0-31.9, adult: Secondary | ICD-10-CM | POA: Diagnosis not present

## 2017-04-04 DIAGNOSIS — J209 Acute bronchitis, unspecified: Secondary | ICD-10-CM | POA: Diagnosis not present

## 2017-04-04 DIAGNOSIS — I739 Peripheral vascular disease, unspecified: Secondary | ICD-10-CM | POA: Diagnosis not present

## 2017-04-04 DIAGNOSIS — E7800 Pure hypercholesterolemia, unspecified: Secondary | ICD-10-CM | POA: Diagnosis not present

## 2017-04-04 DIAGNOSIS — Z789 Other specified health status: Secondary | ICD-10-CM | POA: Diagnosis not present

## 2017-04-12 ENCOUNTER — Telehealth: Payer: Self-pay

## 2017-04-12 NOTE — Telephone Encounter (Signed)
PA for Trulance 3mg  tab was sent to pts plan today after receiving a letter from [facility] stating medication isn't covered under pts plan. Waiting on an approval or denial letter.

## 2017-04-14 NOTE — Telephone Encounter (Signed)
PA approval fro Trulance effective date 02/26/17-02/27/17. Letter will be scanned in chart. Pt notified of approval.

## 2017-04-27 DIAGNOSIS — Z0100 Encounter for examination of eyes and vision without abnormal findings: Secondary | ICD-10-CM | POA: Diagnosis not present

## 2017-05-10 DIAGNOSIS — Z6831 Body mass index (BMI) 31.0-31.9, adult: Secondary | ICD-10-CM | POA: Diagnosis not present

## 2017-05-10 DIAGNOSIS — K580 Irritable bowel syndrome with diarrhea: Secondary | ICD-10-CM | POA: Diagnosis not present

## 2017-05-10 DIAGNOSIS — E039 Hypothyroidism, unspecified: Secondary | ICD-10-CM | POA: Diagnosis not present

## 2017-05-10 DIAGNOSIS — I10 Essential (primary) hypertension: Secondary | ICD-10-CM | POA: Diagnosis not present

## 2017-05-10 DIAGNOSIS — E7800 Pure hypercholesterolemia, unspecified: Secondary | ICD-10-CM | POA: Diagnosis not present

## 2017-05-10 DIAGNOSIS — Z299 Encounter for prophylactic measures, unspecified: Secondary | ICD-10-CM | POA: Diagnosis not present

## 2017-05-10 DIAGNOSIS — R69 Illness, unspecified: Secondary | ICD-10-CM | POA: Diagnosis not present

## 2017-05-10 DIAGNOSIS — I739 Peripheral vascular disease, unspecified: Secondary | ICD-10-CM | POA: Diagnosis not present

## 2017-05-11 DIAGNOSIS — Z1231 Encounter for screening mammogram for malignant neoplasm of breast: Secondary | ICD-10-CM | POA: Diagnosis not present

## 2017-05-13 DIAGNOSIS — Z823 Family history of stroke: Secondary | ICD-10-CM | POA: Diagnosis not present

## 2017-05-13 DIAGNOSIS — E669 Obesity, unspecified: Secondary | ICD-10-CM | POA: Diagnosis not present

## 2017-05-13 DIAGNOSIS — E039 Hypothyroidism, unspecified: Secondary | ICD-10-CM | POA: Diagnosis not present

## 2017-05-13 DIAGNOSIS — Z8249 Family history of ischemic heart disease and other diseases of the circulatory system: Secondary | ICD-10-CM | POA: Diagnosis not present

## 2017-05-13 DIAGNOSIS — K219 Gastro-esophageal reflux disease without esophagitis: Secondary | ICD-10-CM | POA: Diagnosis not present

## 2017-05-13 DIAGNOSIS — K5900 Constipation, unspecified: Secondary | ICD-10-CM | POA: Diagnosis not present

## 2017-05-13 DIAGNOSIS — R69 Illness, unspecified: Secondary | ICD-10-CM | POA: Diagnosis not present

## 2017-05-13 DIAGNOSIS — G250 Essential tremor: Secondary | ICD-10-CM | POA: Diagnosis not present

## 2017-05-13 DIAGNOSIS — Z85828 Personal history of other malignant neoplasm of skin: Secondary | ICD-10-CM | POA: Diagnosis not present

## 2017-05-13 DIAGNOSIS — K589 Irritable bowel syndrome without diarrhea: Secondary | ICD-10-CM | POA: Diagnosis not present

## 2017-05-19 DIAGNOSIS — I739 Peripheral vascular disease, unspecified: Secondary | ICD-10-CM | POA: Diagnosis not present

## 2017-05-19 DIAGNOSIS — Z299 Encounter for prophylactic measures, unspecified: Secondary | ICD-10-CM | POA: Diagnosis not present

## 2017-05-19 DIAGNOSIS — Z6831 Body mass index (BMI) 31.0-31.9, adult: Secondary | ICD-10-CM | POA: Diagnosis not present

## 2017-05-19 DIAGNOSIS — R251 Tremor, unspecified: Secondary | ICD-10-CM | POA: Diagnosis not present

## 2017-05-19 DIAGNOSIS — E039 Hypothyroidism, unspecified: Secondary | ICD-10-CM | POA: Diagnosis not present

## 2017-07-03 DIAGNOSIS — I10 Essential (primary) hypertension: Secondary | ICD-10-CM | POA: Diagnosis not present

## 2017-07-03 DIAGNOSIS — R251 Tremor, unspecified: Secondary | ICD-10-CM | POA: Diagnosis not present

## 2017-07-03 DIAGNOSIS — Z299 Encounter for prophylactic measures, unspecified: Secondary | ICD-10-CM | POA: Diagnosis not present

## 2017-07-03 DIAGNOSIS — I739 Peripheral vascular disease, unspecified: Secondary | ICD-10-CM | POA: Diagnosis not present

## 2017-07-03 DIAGNOSIS — Z6831 Body mass index (BMI) 31.0-31.9, adult: Secondary | ICD-10-CM | POA: Diagnosis not present

## 2017-07-18 ENCOUNTER — Other Ambulatory Visit: Payer: Self-pay | Admitting: Gastroenterology

## 2017-08-08 DIAGNOSIS — I10 Essential (primary) hypertension: Secondary | ICD-10-CM | POA: Diagnosis not present

## 2017-08-08 DIAGNOSIS — Z6831 Body mass index (BMI) 31.0-31.9, adult: Secondary | ICD-10-CM | POA: Diagnosis not present

## 2017-08-08 DIAGNOSIS — K580 Irritable bowel syndrome with diarrhea: Secondary | ICD-10-CM | POA: Diagnosis not present

## 2017-08-08 DIAGNOSIS — Z299 Encounter for prophylactic measures, unspecified: Secondary | ICD-10-CM | POA: Diagnosis not present

## 2017-08-08 DIAGNOSIS — E039 Hypothyroidism, unspecified: Secondary | ICD-10-CM | POA: Diagnosis not present

## 2017-08-08 DIAGNOSIS — I739 Peripheral vascular disease, unspecified: Secondary | ICD-10-CM | POA: Diagnosis not present

## 2017-09-14 ENCOUNTER — Other Ambulatory Visit: Payer: Self-pay | Admitting: Gastroenterology

## 2017-11-14 DIAGNOSIS — Z299 Encounter for prophylactic measures, unspecified: Secondary | ICD-10-CM | POA: Diagnosis not present

## 2017-11-14 DIAGNOSIS — I739 Peripheral vascular disease, unspecified: Secondary | ICD-10-CM | POA: Diagnosis not present

## 2017-11-14 DIAGNOSIS — I10 Essential (primary) hypertension: Secondary | ICD-10-CM | POA: Diagnosis not present

## 2017-11-14 DIAGNOSIS — Z6831 Body mass index (BMI) 31.0-31.9, adult: Secondary | ICD-10-CM | POA: Diagnosis not present

## 2017-11-14 DIAGNOSIS — E039 Hypothyroidism, unspecified: Secondary | ICD-10-CM | POA: Diagnosis not present

## 2017-11-14 DIAGNOSIS — R251 Tremor, unspecified: Secondary | ICD-10-CM | POA: Diagnosis not present

## 2017-11-27 DIAGNOSIS — R69 Illness, unspecified: Secondary | ICD-10-CM | POA: Diagnosis not present

## 2017-12-11 ENCOUNTER — Encounter: Payer: Self-pay | Admitting: Internal Medicine

## 2017-12-14 DIAGNOSIS — Z23 Encounter for immunization: Secondary | ICD-10-CM | POA: Diagnosis not present

## 2017-12-21 DIAGNOSIS — R69 Illness, unspecified: Secondary | ICD-10-CM | POA: Diagnosis not present

## 2018-01-01 DIAGNOSIS — R251 Tremor, unspecified: Secondary | ICD-10-CM | POA: Diagnosis not present

## 2018-01-01 DIAGNOSIS — I10 Essential (primary) hypertension: Secondary | ICD-10-CM | POA: Diagnosis not present

## 2018-01-01 DIAGNOSIS — Z6831 Body mass index (BMI) 31.0-31.9, adult: Secondary | ICD-10-CM | POA: Diagnosis not present

## 2018-01-01 DIAGNOSIS — Z299 Encounter for prophylactic measures, unspecified: Secondary | ICD-10-CM | POA: Diagnosis not present

## 2018-01-01 DIAGNOSIS — I739 Peripheral vascular disease, unspecified: Secondary | ICD-10-CM | POA: Diagnosis not present

## 2018-02-26 DIAGNOSIS — Z299 Encounter for prophylactic measures, unspecified: Secondary | ICD-10-CM | POA: Diagnosis not present

## 2018-02-26 DIAGNOSIS — Z7189 Other specified counseling: Secondary | ICD-10-CM | POA: Diagnosis not present

## 2018-02-26 DIAGNOSIS — Z0000 Encounter for general adult medical examination without abnormal findings: Secondary | ICD-10-CM | POA: Diagnosis not present

## 2018-02-26 DIAGNOSIS — E7800 Pure hypercholesterolemia, unspecified: Secondary | ICD-10-CM | POA: Diagnosis not present

## 2018-02-26 DIAGNOSIS — Z1331 Encounter for screening for depression: Secondary | ICD-10-CM | POA: Diagnosis not present

## 2018-02-26 DIAGNOSIS — E039 Hypothyroidism, unspecified: Secondary | ICD-10-CM | POA: Diagnosis not present

## 2018-02-26 DIAGNOSIS — Z79899 Other long term (current) drug therapy: Secondary | ICD-10-CM | POA: Diagnosis not present

## 2018-02-26 DIAGNOSIS — Z1339 Encounter for screening examination for other mental health and behavioral disorders: Secondary | ICD-10-CM | POA: Diagnosis not present

## 2018-02-26 DIAGNOSIS — Z1211 Encounter for screening for malignant neoplasm of colon: Secondary | ICD-10-CM | POA: Diagnosis not present

## 2018-02-26 DIAGNOSIS — Z6831 Body mass index (BMI) 31.0-31.9, adult: Secondary | ICD-10-CM | POA: Diagnosis not present

## 2018-02-26 DIAGNOSIS — I10 Essential (primary) hypertension: Secondary | ICD-10-CM | POA: Diagnosis not present

## 2018-03-07 ENCOUNTER — Telehealth: Payer: Self-pay | Admitting: Internal Medicine

## 2018-03-07 ENCOUNTER — Telehealth: Payer: Self-pay

## 2018-03-07 NOTE — Telephone Encounter (Signed)
Spoke with pt and her member ID is [id number] for [employer]. PA was submitted through [email or web address]. waiting on approval or denial.

## 2018-03-07 NOTE — Telephone Encounter (Signed)
Patient called with bin [phone]

## 2018-03-07 NOTE — Telephone Encounter (Signed)
Lmom, waiting on a return call. Will confirm insurance info with pt. im trying to submit a PA over the phone and I't will not go thought.

## 2018-03-07 NOTE — Telephone Encounter (Signed)
Noted  

## 2018-03-08 NOTE — Telephone Encounter (Signed)
Received approval for Trulance  Is good through 02/28/2019. Approval letter scanned in chart.

## 2018-03-29 ENCOUNTER — Encounter: Payer: Self-pay | Admitting: Gastroenterology

## 2018-03-29 ENCOUNTER — Ambulatory Visit: Payer: Medicare Other | Admitting: Gastroenterology

## 2018-03-29 VITALS — BP 120/75 | HR 83 | Temp 97.5°F | Ht 66.0 in | Wt 184.8 lb

## 2018-03-29 DIAGNOSIS — K219 Gastro-esophageal reflux disease without esophagitis: Secondary | ICD-10-CM | POA: Diagnosis not present

## 2018-03-29 DIAGNOSIS — K582 Mixed irritable bowel syndrome: Secondary | ICD-10-CM | POA: Diagnosis not present

## 2018-03-29 MED ORDER — DICYCLOMINE HCL 10 MG PO CAPS: 10.0000 mg | ORAL_CAPSULE | ORAL | 3 refills | Status: DC | PRN

## 2018-03-29 NOTE — Patient Instructions (Signed)
Continue Trulance once each day. On days you go out to eat, don't take the Trulance. You can take 1 capsule Bentyl 30 minutes before going out to eat. This can cause dry mouth, dizziness, confusion. Stop taking if this occurs.  We will see you in 3-4 months to make sure you are doing ok!  It was a pleasure to see you today. I strive to create trusting relationships with patients to provide genuine, compassionate, and quality care. I value your feedback. If you receive a survey regarding your visit,  I greatly appreciate you taking time to fill this out.   Annitta Needs, PhD, ANP-BC [facility]

## 2018-03-29 NOTE — Progress Notes (Signed)
Referring Provider: Glenda Chroman, MD Primary Care Physician:  Glenda Chroman, MD  Primary GI: Dr. Gala Romney   Chief Complaint  Patient presents with  . Constipation  . Diarrhea    HPI:   [name] is a 73 y.o. female presenting today with a history of chronic constipation and GERD, due for colonoscopy in 2022.   Thayer Headings, daughter-in-law is present.   Trulance for constipation. Won't take if stomach is messed up. Stress makes it worse. Dairy products don't bother.    Diarrhea if goes out to eat. Postprandial urgency. Noticed more with fatty/greasy foods. No rectal bleeding. No concerning upper GI signs/symptoms. Protonix once daily.   Past Medical History:  Diagnosis Date  . Arthritis   . Depression   . Diverticulitis   . GERD (gastroesophageal reflux disease)   . Heart murmur    MILD, NO CARDIOLOGIST  . Hyperlipidemia   . Hypothyroidism   . IBS (irritable bowel syndrome)   . Pneumonia   . Primary localized osteoarthritis of right hip 11/11/2014  . Shortness of breath    with exertion  . Umbilical hernia     Past Surgical History:  Procedure Laterality Date  . ABDOMINAL HYSTERECTOMY  2003   OVARIES REMOVED  . CATARACT EXTRACTION W/PHACO Left 05/03/2012   Procedure: CATARACT EXTRACTION PHACO AND INTRAOCULAR LENS PLACEMENT (IOC);  Surgeon: Tonny Branch, MD;  Location: [facility];  Service: Ophthalmology;  Laterality: Left;  CDE: 16.80  . CATARACT EXTRACTION W/PHACO Right 05/21/2012   Procedure: CATARACT EXTRACTION PHACO AND INTRAOCULAR LENS PLACEMENT (IOC);  Surgeon: Tonny Branch, MD;  Location: [facility];  Service: Ophthalmology;  Laterality: Right;  CDE=12.31  . [place]  . CHOLECYSTECTOMY  01/10/2011   Procedure: LAPAROSCOPIC CHOLECYSTECTOMY;  Surgeon: Scherry Ran;  Location: [facility];  Service: General;  Laterality: N/A;  . COLONOSCOPY  2011   Dr. Arnoldo Morale: sigmoid diverticulosis, repeat Aug 2016 due to history of adenomatous polyps  . COLONOSCOPY N/A  02/04/2013   Dr. Gala Romney :Colonic diverticulosis, repeat in 2019  . COLONOSCOPY N/A 04/09/2015   Dr. Gala Romney: tubular adenoma and diverticulosis. Surveillance in 5 years   . ESOPHAGOGASTRODUODENOSCOPY  2011   Dr. Arnoldo Morale: multiple gastric polyps, path with mild chronic gastritis  . ESOPHAGOGASTRODUODENOSCOPY N/A 02/04/2013   Dr. Rourk:Small hiatal hernia. Gastric polyps s/p bx, negative H.pylori. Benign path.   . EUS N/A 08/29/2013   Dr. Ardis Hughs: no clear cause for acute pancreatitis. Autoimmune labs normal. Question HCTZ as culprit  . TOTAL HIP ARTHROPLASTY Right 11/11/2014   Procedure: TOTAL RIGHT HIP ARTHROPLASTY;  Surgeon: Marchia Bond, MD;  Location: [place];  Service: Orthopedics;  Laterality: Right;  . TUBAL LIGATION  1970's    Current Outpatient Medications  Medication Sig Dispense Refill  . acetaminophen (TYLENOL) 650 MG CR tablet Take 1,300 mg by mouth every 8 (eight) hours as needed for pain.    . Diphenhydramine-APAP, sleep, (UNISOM PM PAIN PO) Take at bedtime by mouth.    Marland Kitchen ibuprofen (ADVIL,MOTRIN) 200 MG tablet Take 400 mg by mouth every 6 (six) hours as needed.    Marland Kitchen levothyroxine (SYNTHROID, LEVOTHROID) 88 MCG tablet Take 88 mcg by mouth daily before breakfast.     . loperamide (IMODIUM) 2 MG capsule Take 2 mg by mouth as needed for diarrhea or loose stools.    . Magnesium 250 MG TABS Take by mouth daily.    Marland Kitchen OVER THE COUNTER MEDICATION Vitafusion 2 by mouth once a day    .  pantoprazole (PROTONIX) 40 MG tablet TAKE 1 TABLET BY MOUTH ONCE DAILY 90 tablet 3  . primidone (MYSOLINE) 50 MG tablet Take 50 mg by mouth daily.    . Probiotic Product (PROBIOTIC DAILY PO) Take by mouth daily.    . propranolol (INDERAL) 80 MG tablet Take 80 mg by mouth every evening.     . TRULANCE 3 MG TABS TAKE 1 TABLET BY MOUTH ONCE DAILY 30 tablet 11   No current facility-administered medications for this visit.     Allergies as of 03/29/2018 - Review Complete 03/29/2018  Allergen Reaction Noted  .  Metoclopramide hcl Other (See Comments) 01/04/2011  . Aspirin  01/28/2013  . Codeine Nausea Only 04/09/2015    Family History  Problem Relation Age of Onset  . Anesthesia problems Neg Hx   . Hypotension Neg Hx   . Malignant hyperthermia Neg Hx   . Pseudochol deficiency Neg Hx   . Colon cancer Neg Hx   . Pancreatitis Neg Hx     Social History   Socioeconomic History  . Marital status: Married    Spouse name: Not on file  . Number of children: Not on file  . Years of education: Not on file  . Highest education level: Not on file  Occupational History  . Not on file  Social Needs  . Financial resource strain: Not on file  . Food insecurity:    Worry: Not on file    Inability: Not on file  . Transportation needs:    Medical: Not on file    Non-medical: Not on file  Tobacco Use  . Smoking status: Never Smoker  . Smokeless tobacco: Never Used  Substance and Sexual Activity  . Alcohol use: No  . Drug use: No  . Sexual activity: Yes    Birth control/protection: None  Lifestyle  . Physical activity:    Days per week: Not on file    Minutes per session: Not on file  . Stress: Not on file  Relationships  . Social connections:    Talks on phone: Not on file    Gets together: Not on file    Attends religious service: Not on file    Active member of club or organization: Not on file    Attends meetings of clubs or organizations: Not on file    Relationship status: Not on file  Other Topics Concern  . Not on file  Social History Narrative  . Not on file    Review of Systems: Gen: Denies fever, chills, anorexia. Denies fatigue, weakness, weight loss.  CV: Denies chest pain, palpitations, syncope, peripheral edema, and claudication. Resp: Denies dyspnea at rest, cough, wheezing, coughing up blood, and pleurisy. GI: see HPI Derm: Denies rash, itching, dry skin Psych: Denies depression, anxiety, memory loss, confusion. No homicidal or suicidal ideation.  Heme: Denies  bruising, bleeding, and enlarged lymph nodes.  Physical Exam: BP 120/75   Pulse 83   Temp (!) 97.5 F (36.4 C) (Oral)   Ht 5\' 6"  (1.676 m)   Wt 184 lb 12.8 oz (83.8 kg)   BMI 29.83 kg/m  General:   Alert and oriented. No distress noted. Pleasant and cooperative.  Head:  Normocephalic and atraumatic. Eyes:  Conjuctiva clear without scleral icterus. Mouth:  Oral mucosa pink and moist. Good dentition. No lesions. Lungs: clear bilaterally Cardiac: systolic murmur Abdomen:  +BS, soft, non-tender and non-distended. No rebound or guarding. No HSM or masses noted. Possible umbilical hernia Msk:  Symmetrical  without gross deformities. Normal posture. Extremities:  Without edema. Neurologic:  Alert and  oriented x4 Psych:  Alert and cooperative. Normal mood and affect.

## 2018-03-31 NOTE — Assessment & Plan Note (Signed)
Doing well on Protonix once daily. No alarm signs/symptoms.

## 2018-03-31 NOTE — Assessment & Plan Note (Signed)
Predominantly constipation. Occasional loose stool if going out to eat, dietary-related. Bentyl prn for loose stool. Hold for constipation. Return in 3-4 months.

## 2018-04-02 NOTE — Progress Notes (Signed)
CC'D TO PCP °

## 2018-05-07 DIAGNOSIS — Z789 Other specified health status: Secondary | ICD-10-CM | POA: Diagnosis not present

## 2018-05-07 DIAGNOSIS — I10 Essential (primary) hypertension: Secondary | ICD-10-CM | POA: Diagnosis not present

## 2018-05-07 DIAGNOSIS — Z299 Encounter for prophylactic measures, unspecified: Secondary | ICD-10-CM | POA: Diagnosis not present

## 2018-05-07 DIAGNOSIS — Z6831 Body mass index (BMI) 31.0-31.9, adult: Secondary | ICD-10-CM | POA: Diagnosis not present

## 2018-05-07 DIAGNOSIS — J069 Acute upper respiratory infection, unspecified: Secondary | ICD-10-CM | POA: Diagnosis not present

## 2018-06-22 DIAGNOSIS — J309 Allergic rhinitis, unspecified: Secondary | ICD-10-CM | POA: Diagnosis not present

## 2018-06-22 DIAGNOSIS — I10 Essential (primary) hypertension: Secondary | ICD-10-CM | POA: Diagnosis not present

## 2018-06-22 DIAGNOSIS — Z299 Encounter for prophylactic measures, unspecified: Secondary | ICD-10-CM | POA: Diagnosis not present

## 2018-06-22 DIAGNOSIS — I739 Peripheral vascular disease, unspecified: Secondary | ICD-10-CM | POA: Diagnosis not present

## 2018-06-28 ENCOUNTER — Encounter: Payer: Self-pay | Admitting: Internal Medicine

## 2018-06-28 ENCOUNTER — Ambulatory Visit (INDEPENDENT_AMBULATORY_CARE_PROVIDER_SITE_OTHER): Payer: Medicare HMO | Admitting: Gastroenterology

## 2018-06-28 ENCOUNTER — Other Ambulatory Visit: Payer: Self-pay

## 2018-06-28 ENCOUNTER — Encounter: Payer: Self-pay | Admitting: Gastroenterology

## 2018-06-28 DIAGNOSIS — K5900 Constipation, unspecified: Secondary | ICD-10-CM

## 2018-06-28 DIAGNOSIS — K219 Gastro-esophageal reflux disease without esophagitis: Secondary | ICD-10-CM | POA: Diagnosis not present

## 2018-06-28 NOTE — Patient Instructions (Signed)
Continue Trulance daily. I am glad you are doing better with not taking it on the times you go out to eat!  Continue Protonix once daily.  Please call if you need anything!  I enjoyed talking with you again today! As you know, I value our relationship and want to provide genuine, compassionate, and quality care. I welcome your feedback. If you receive a survey regarding your visit,  I greatly appreciate you taking time to fill this out. See you next time!  Annitta Needs, PhD, ANP-BC [facility]

## 2018-06-28 NOTE — Progress Notes (Signed)
Primary Care Physician:  Glenda Chroman, MD  Primary GI: Dr. Gala Romney   Virtual Visit via Telephone Note Due to COVID-19, visit is conducted virtually and was requested by patient.   I connected with [name] on 06/28/18 at  9:00 AM EDT by telephone and verified that I am speaking with the correct person using two identifiers.   I discussed the limitations, risks, security and privacy concerns of performing an evaluation and management service by telephone and the availability of in person appointments. I also discussed with the patient that there may be a patient responsible charge related to this service. The patient expressed understanding and agreed to proceed.  Chief Complaint  Patient presents with   Gastroesophageal Reflux    f/u, doing ok     History of Present Illness: 73 year old female with history of chronic constipation and GERD, colonoscopy due in 2022. Trulance for constipation. Doesn't take Trulance if going out to eat. No cramping when going out to eat and takes one dosing of  Bentyl if needed. No rectal bleeding. No dysphagia. Appetite is very good. Misses her family. Continues to practice social distancing. No changes in medical history since last seen.    Past Medical History:  Diagnosis Date   Arthritis    Depression    Diverticulitis    GERD (gastroesophageal reflux disease)    Heart murmur    MILD, NO CARDIOLOGIST   Hyperlipidemia    Hypothyroidism    IBS (irritable bowel syndrome)    Pneumonia    Primary localized osteoarthritis of right hip 11/11/2014   Shortness of breath    with exertion   Umbilical hernia      Past Surgical History:  Procedure Laterality Date   ABDOMINAL HYSTERECTOMY  2003   OVARIES REMOVED   CATARACT EXTRACTION W/PHACO Left 05/03/2012   Procedure: CATARACT EXTRACTION PHACO AND INTRAOCULAR LENS PLACEMENT ([place]);  Surgeon: Tonny Branch, MD;  Location: [facility];  Service: Ophthalmology;  Laterality: Left;  CDE:  16.80   CATARACT EXTRACTION W/PHACO Right 05/21/2012   Procedure: CATARACT EXTRACTION PHACO AND INTRAOCULAR LENS PLACEMENT (IOC);  Surgeon: Tonny Branch, MD;  Location: [facility];  Service: Ophthalmology;  Laterality: Right;  CDE=12.31   CESAREAN SECTION  1967   CHOLECYSTECTOMY  01/10/2011   Procedure: LAPAROSCOPIC CHOLECYSTECTOMY;  Surgeon: Scherry Ran;  Location: [facility];  Service: General;  Laterality: N/A;   COLONOSCOPY  2011   Dr. Arnoldo Morale: sigmoid diverticulosis, repeat Aug 2016 due to history of adenomatous polyps   COLONOSCOPY N/A 02/04/2013   Dr. Gala Romney :Colonic diverticulosis, repeat in 2019   COLONOSCOPY N/A 04/09/2015   Dr. Gala Romney: tubular adenoma and diverticulosis. Surveillance in 5 years    ESOPHAGOGASTRODUODENOSCOPY  2011   Dr. Arnoldo Morale: multiple gastric polyps, path with mild chronic gastritis   ESOPHAGOGASTRODUODENOSCOPY N/A 02/04/2013   Dr. Rourk:Small hiatal hernia. Gastric polyps s/p bx, negative H.pylori. Benign path.    EUS N/A 08/29/2013   Dr. Ardis Hughs: no clear cause for acute pancreatitis. Autoimmune labs normal. 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   dicyclomine (BENTYL) 10 MG capsule Take 1 capsule (10 mg  total) by mouth as needed for spasms. 30 minutes before going out to eat.   Diphenhydramine-APAP, sleep, (UNISOM PM PAIN PO) Take at bedtime by mouth.   escitalopram (LEXAPRO) 10 MG tablet Take 1 tablet by mouth daily.   ibuprofen (ADVIL,MOTRIN) 200 MG tablet Take 400 mg by mouth every 6 (six) hours as needed.   levothyroxine (SYNTHROID, LEVOTHROID) 88 MCG tablet Take 88 mcg by mouth daily before breakfast.    loperamide  (IMODIUM) 2 MG capsule Take 2 mg by mouth as needed for diarrhea or loose stools.   Magnesium 250 MG TABS Take by mouth daily.   OVER THE COUNTER MEDICATION Vitafusion 2 by mouth once a day   pantoprazole (PROTONIX) 40 MG tablet TAKE 1 TABLET BY MOUTH ONCE DAILY   primidone (MYSOLINE) 50 MG tablet Take 50 mg by mouth daily.   Probiotic Product (PROBIOTIC DAILY PO) Take by mouth daily.   TRULANCE 3 MG TABS TAKE 1 TABLET BY MOUTH ONCE DAILY      Review of Systems: Gen: Denies fever, chills, anorexia. Denies fatigue, weakness, weight loss.  CV: Denies chest pain, palpitations, syncope, peripheral edema, and claudication. Resp: Denies dyspnea at rest, cough, wheezing, coughing up blood, and pleurisy. GI: see HPI Derm: Denies rash, itching, dry skin Psych: Denies depression, anxiety, memory loss, confusion. No homicidal or suicidal ideation.  Heme: Denies bruising, bleeding, and enlarged lymph nodes.  Observations/Objective: No distress. Unable to perform physical exam due to telephone encounter. No video available.   Assessment and Plan: 73 year old pleasant female with history of constipation and GERD, well managed with Trulance and Protonix. As she is doing well, we will see her in 6-8 months. Hopefully, we can then be on a yearly schedule. Next colonoscopy in 2022.   Follow Up Instructions: See AVS   I discussed the assessment and treatment plan with the patient. The patient was provided an opportunity to ask questions and all were answered. The patient agreed with the plan and demonstrated an understanding of the instructions.   The patient was advised to call back or seek an in-person evaluation if the symptoms worsen or if the condition fails to improve as anticipated.  I provided 10 minutes of non-face-to-face time during this encounter.  Annitta Needs, PhD, ANP-BC [facility]

## 2018-06-28 NOTE — Progress Notes (Signed)
CC'D TO PCP °

## 2018-08-21 ENCOUNTER — Other Ambulatory Visit: Payer: Self-pay | Admitting: Nurse Practitioner

## 2018-08-24 ENCOUNTER — Telehealth: Payer: Self-pay

## 2018-08-24 NOTE — Telephone Encounter (Signed)
Refill request received from [facility] for Trulance 3 mg #30 take 1 tab po once daily.

## 2018-08-24 NOTE — Telephone Encounter (Signed)
Rx sent by refill box. 

## 2018-08-28 DIAGNOSIS — E039 Hypothyroidism, unspecified: Secondary | ICD-10-CM | POA: Diagnosis not present

## 2018-08-28 DIAGNOSIS — Z299 Encounter for prophylactic measures, unspecified: Secondary | ICD-10-CM | POA: Diagnosis not present

## 2018-08-28 DIAGNOSIS — I739 Peripheral vascular disease, unspecified: Secondary | ICD-10-CM | POA: Diagnosis not present

## 2018-08-28 DIAGNOSIS — I10 Essential (primary) hypertension: Secondary | ICD-10-CM | POA: Diagnosis not present

## 2018-08-28 DIAGNOSIS — Z6832 Body mass index (BMI) 32.0-32.9, adult: Secondary | ICD-10-CM | POA: Diagnosis not present

## 2018-09-13 DIAGNOSIS — Z299 Encounter for prophylactic measures, unspecified: Secondary | ICD-10-CM | POA: Diagnosis not present

## 2018-09-13 DIAGNOSIS — Z6832 Body mass index (BMI) 32.0-32.9, adult: Secondary | ICD-10-CM | POA: Diagnosis not present

## 2018-09-13 DIAGNOSIS — L259 Unspecified contact dermatitis, unspecified cause: Secondary | ICD-10-CM | POA: Diagnosis not present

## 2018-09-13 DIAGNOSIS — I739 Peripheral vascular disease, unspecified: Secondary | ICD-10-CM | POA: Diagnosis not present

## 2018-09-13 DIAGNOSIS — Z713 Dietary counseling and surveillance: Secondary | ICD-10-CM | POA: Diagnosis not present

## 2018-10-09 DIAGNOSIS — Z299 Encounter for prophylactic measures, unspecified: Secondary | ICD-10-CM | POA: Diagnosis not present

## 2018-10-09 DIAGNOSIS — W5501XA Bitten by cat, initial encounter: Secondary | ICD-10-CM | POA: Diagnosis not present

## 2018-10-09 DIAGNOSIS — Z6832 Body mass index (BMI) 32.0-32.9, adult: Secondary | ICD-10-CM | POA: Diagnosis not present

## 2018-10-09 DIAGNOSIS — E039 Hypothyroidism, unspecified: Secondary | ICD-10-CM | POA: Diagnosis not present

## 2018-10-17 ENCOUNTER — Other Ambulatory Visit: Payer: Self-pay | Admitting: Gastroenterology

## 2018-10-24 DIAGNOSIS — I739 Peripheral vascular disease, unspecified: Secondary | ICD-10-CM | POA: Diagnosis not present

## 2018-10-24 DIAGNOSIS — R300 Dysuria: Secondary | ICD-10-CM | POA: Diagnosis not present

## 2018-10-24 DIAGNOSIS — Z299 Encounter for prophylactic measures, unspecified: Secondary | ICD-10-CM | POA: Diagnosis not present

## 2018-12-12 DIAGNOSIS — R69 Illness, unspecified: Secondary | ICD-10-CM | POA: Diagnosis not present

## 2019-01-10 DIAGNOSIS — N182 Chronic kidney disease, stage 2 (mild): Secondary | ICD-10-CM | POA: Diagnosis not present

## 2019-01-10 DIAGNOSIS — E039 Hypothyroidism, unspecified: Secondary | ICD-10-CM | POA: Diagnosis not present

## 2019-01-10 DIAGNOSIS — I739 Peripheral vascular disease, unspecified: Secondary | ICD-10-CM | POA: Diagnosis not present

## 2019-01-10 DIAGNOSIS — Z299 Encounter for prophylactic measures, unspecified: Secondary | ICD-10-CM | POA: Diagnosis not present

## 2019-01-10 DIAGNOSIS — Z6832 Body mass index (BMI) 32.0-32.9, adult: Secondary | ICD-10-CM | POA: Diagnosis not present

## 2019-01-10 DIAGNOSIS — J439 Emphysema, unspecified: Secondary | ICD-10-CM | POA: Diagnosis not present

## 2019-01-16 ENCOUNTER — Ambulatory Visit (INDEPENDENT_AMBULATORY_CARE_PROVIDER_SITE_OTHER): Payer: Medicare HMO | Admitting: Gastroenterology

## 2019-01-16 ENCOUNTER — Encounter: Payer: Self-pay | Admitting: Gastroenterology

## 2019-01-16 DIAGNOSIS — K582 Mixed irritable bowel syndrome: Secondary | ICD-10-CM

## 2019-01-16 DIAGNOSIS — K219 Gastro-esophageal reflux disease without esophagitis: Secondary | ICD-10-CM

## 2019-01-16 NOTE — Progress Notes (Signed)
Primary Care Physician:  Glenda Chroman, MD  Primary GI: Dr. Gala Romney   Patient Location: Home   Provider Location: [facility] office   Reason for Visit: Follow-up    Persons present on the virtual encounter, with roles: Patient and NP   Total time (minutes) spent on medical discussion: 12 minutes   Due to COVID-19, visit was conducted using virtual method.  Visit was requested by patient.  Virtual Visit via Telephone Note Due to COVID-19, visit is conducted virtually and was requested by patient.   I connected with [name] on 01/16/19 at  9:00 AM EST by telephone and verified that I am speaking with the correct person using two identifiers.   I discussed the limitations, risks, security and privacy concerns of performing an evaluation and management service by telephone and the availability of in person appointments. I also discussed with the patient that there may be a patient responsible charge related to this service. The patient expressed understanding and agreed to proceed.  Chief Complaint  Patient presents with  . Diarrhea  . Gas     History of Present Illness: 73 year old pleasant female with history of chronic constipation and GERD, presenting virtually via phone call for follow-up. Due for colonoscopy in 2022.   Constipation: rare. Now having looser stool. Urgency. Taking Trulance but not Miralax. Only takes Bentyl if goes out to eat as she will have cramping and urgency. Due to COVID, not going out to eat much at all. +flatus. Eating beans, onions.   GERD: Protonix once daily. Belching more. No dysphagia. Drinking more caffeine. Pepsi, tea. One can of pepsi a day. Drinks more tea during the day. Several glasses per day.   Past Medical History:  Diagnosis Date  . Arthritis   . Depression   . Diverticulitis   . GERD (gastroesophageal reflux disease)   . Heart murmur    MILD, NO CARDIOLOGIST  . Hyperlipidemia   . Hypothyroidism   . IBS (irritable bowel  syndrome)   . Pneumonia   . Primary localized osteoarthritis of right hip 11/11/2014  . Shortness of breath    with exertion  . Umbilical hernia      Past Surgical History:  Procedure Laterality Date  . ABDOMINAL HYSTERECTOMY  2003   OVARIES REMOVED  . CATARACT EXTRACTION W/PHACO Left 05/03/2012   Procedure: CATARACT EXTRACTION PHACO AND INTRAOCULAR LENS PLACEMENT (IOC);  Surgeon: Tonny Branch, MD;  [facility];  Service: Ophthalmology;  Laterality: Left;  CDE: 16.80  . CATARACT EXTRACTION W/PHACO Right 05/21/2012   Procedure: CATARACT EXTRACTION PHACO AND INTRAOCULAR LENS PLACEMENT (IOC);  Surgeon: Tonny Branch, MD;  Location: [facility];  Service: Ophthalmology;  Laterality: Right;  CDE=12.31  . [place]  . CHOLECYSTECTOMY  01/10/2011   Procedure: LAPAROSCOPIC CHOLECYSTECTOMY;  Surgeon: Scherry Ran;  Location: [facility];  Service: General;  Laterality: N/A;  . COLONOSCOPY  2011   Dr. Arnoldo Morale: sigmoid diverticulosis, repeat Aug 2016 due to history of adenomatous polyps  . COLONOSCOPY N/A 02/04/2013   Dr. Gala Romney :Colonic diverticulosis, repeat in 2019  . COLONOSCOPY N/A 04/09/2015   Dr. Gala Romney: tubular adenoma and diverticulosis. Surveillance in 5 years   . ESOPHAGOGASTRODUODENOSCOPY  2011   Dr. Arnoldo Morale: multiple gastric polyps, path with mild chronic gastritis  . ESOPHAGOGASTRODUODENOSCOPY N/A 02/04/2013   Dr. Rourk:Small hiatal hernia. Gastric polyps s/p bx, negative H.pylori. Benign path.   . EUS N/A 08/29/2013   Dr. Ardis Hughs: no clear cause for acute pancreatitis.  Autoimmune labs normal. Question HCTZ as culprit  . 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 TUBAL LIGATION  1970's     Current Meds  Medication Sig  . acetaminophen (TYLENOL) 650 MG CR tablet Take 1,300 mg by mouth every 8 (eight) hours as needed for pain.  Marland Kitchen dicyclomine  (BENTYL) 10 MG capsule Take 1 capsule (10 mg total) by mouth as needed for spasms. 30 minutes before going out to eat.  . Diphenhydramine-APAP, sleep, (UNISOM PM PAIN PO) Take at bedtime by mouth.  . escitalopram (LEXAPRO) 10 MG tablet Take 1 tablet by mouth daily.  Marland Kitchen ibuprofen (ADVIL,MOTRIN) 200 MG tablet Take 400 mg by mouth every 6 (six) hours as needed.  Marland Kitchen levothyroxine (SYNTHROID, LEVOTHROID) 88 MCG tablet Take 88 mcg by mouth daily before breakfast.   . loperamide (IMODIUM) 2 MG capsule Take 2 mg by mouth as needed for diarrhea or loose stools.  . Magnesium 250 MG TABS Take by mouth daily.  Marland Kitchen OVER THE COUNTER MEDICATION Vitafusion 2 by mouth once a day  . pantoprazole (PROTONIX) 40 MG tablet Take 1 tablet by mouth once daily  . primidone (MYSOLINE) 50 MG tablet Take 50 mg by mouth daily.  . Probiotic Product (PROBIOTIC DAILY PO) Take by mouth daily.  . TRULANCE 3 MG TABS Take 1 tablet by mouth once daily     Family History  Problem Relation Age of Onset  . Anesthesia problems Neg Hx   . Hypotension Neg Hx   . Malignant hyperthermia Neg Hx   . Pseudochol deficiency Neg Hx   . Colon cancer Neg Hx   . Pancreatitis Neg Hx     Social History   Socioeconomic History  . Marital status: Married    Spouse name: Not on file  . Number of children: Not on file  . Years of education: Not on file  . Highest education level: Not on file  Occupational History  . Not on file  Social Needs  . Financial resource strain: Not on file  . Food insecurity    Worry: Not on file    Inability: Not on file  . Transportation needs    Medical: Not on file    Non-medical: Not on file  Tobacco Use  . Smoking status: Never Smoker  . Smokeless tobacco: Never Used  Substance and Sexual Activity  . Alcohol use: No  . Drug use: No  . Sexual activity: Yes    Birth control/protection: None  Lifestyle  . Physical activity    Days per week: Not on file    Minutes per session: Not on file  .  Stress: Not on file  Relationships  . Social Herbalist on phone: Not on file    Gets together: Not on file    Attends religious service: Not on file    Active member of club or organization: Not on file    Attends meetings of clubs or organizations: Not on file    Relationship status: Not on file  Other Topics Concern  . Not on file  Social History Narrative  . Not on file       Review of Systems: Gen: Denies fever, chills, anorexia. Denies fatigue, weakness, weight loss.  CV: Denies chest pain, palpitations, syncope, peripheral edema, and claudication. Resp: Denies dyspnea at rest, cough, wheezing, coughing up blood,  and pleurisy. GI: see HPI Derm: Denies rash, itching, dry skin Psych: +anxiety Heme: Denies bruising, bleeding, and enlarged lymph nodes.  Observations/Objective: No distress. Unable to perform physical exam due to telephone encounter. No video available.   Assessment and Plan: 73 year old female with history of GERD, constipation, presenting via telephone today for visit due to COVID-19 restrictions. Overall, doing fairly well. However, she has noted GERD exacerbations in setting of dietary intake, including caffeine and carbonation. Constipation now trending towards looser stool, so we will have her hold off on Trulance and just take as needed. We discussed diet intake that was also contributing to bloating/gas. No alarm signs/symptoms.  GERD diet and gas diet provided. Reduce/avoid caffeine (drinks tea throughout day) and carbonation. Continue PPI daily. Hold Trulance for now and may need to take this every other day. Return in 6 months or sooner as needed.   Follow Up Instructions: See AVS   I discussed the assessment and treatment plan with the patient. The patient was provided an opportunity to ask questions and all were answered. The patient agreed with the plan and demonstrated an understanding of the instructions.   The patient was advised to  call back or seek an in-person evaluation if the symptoms worsen or if the condition fails to improve as anticipated.  I provided *12 minutes of non-face-to-face time during this encounter.  Annitta Needs, PhD, ANP-BC [facility]

## 2019-01-16 NOTE — Progress Notes (Signed)
CC'ED TO PCP 

## 2019-01-16 NOTE — Patient Instructions (Addendum)
Continue taking Protonix once each morning, 30 minutes before breakfast.  Let's hold off on Trulance now and see how your bowels even out. It may be that you take this every other day or every few days. Call us if any issues trying to get a good schedule with this.  Avoid caffeine and carbonation, as this can worsen reflux symptoms. Some of the foods you are eating are contributing to potential gas, so I have included a handout for this as well.  Please call if any concerns, otherwise we will see you in 6 months!  Have a wonderful holiday season!   I enjoyed seeing you again today! As you know, I value our relationship and want to provide genuine, compassionate, and quality care. I welcome your feedback. If you receive a survey regarding your visit,  I greatly appreciate you taking time to fill this out. See you next time!  Annitta Needs, PhD, ANP-BC [facility]   Gastroesophageal Reflux Disease, Adult Gastroesophageal reflux (GER) happens when acid from the stomach flows up into the tube that connects the mouth and the stomach (esophagus). Normally, food travels down the esophagus and stays in the stomach to be digested. However, when a person has GER, food and stomach acid sometimes move back up into the esophagus. If this becomes a more serious problem, the person may be diagnosed with a disease called gastroesophageal reflux disease (GERD). GERD occurs when the reflux:  Happens often.  Causes frequent or severe symptoms.  Causes problems such as damage to the esophagus. When stomach acid comes in contact with the esophagus, the acid may cause soreness (inflammation) in the esophagus. Over time, GERD may create small holes (ulcers) in the lining of the esophagus. What are the causes? This condition is caused by a problem with the muscle between the esophagus and the stomach (lower esophageal sphincter, or LES). Normally, the LES muscle closes after food passes through the  esophagus to the stomach. When the LES is weakened or abnormal, it does not close properly, and that allows food and stomach acid to go back up into the esophagus. The LES can be weakened by certain dietary substances, medicines, and medical conditions, including:  Tobacco use.  Pregnancy.  Having a hiatal hernia.  Alcohol use.  Certain foods and beverages, such as coffee, chocolate, onions, and peppermint. What increases the risk? You are more likely to develop this condition if you:  Have an increased body weight.  Have a connective tissue disorder.  Use NSAID medicines. What are the signs or symptoms? Symptoms of this condition include:  Heartburn.  Difficult or painful swallowing.  The feeling of having a lump in the throat.  Abitter taste in the mouth.  Bad breath.  Having a large amount of saliva.  Having an upset or bloated stomach.  Belching.  Chest pain. Different conditions can cause chest pain. Make sure you see your health care provider if you experience chest pain.  Shortness of breath or wheezing.  Ongoing (chronic) cough or a night-time cough.  Wearing away of tooth enamel.  Weight loss. How is this diagnosed? Your health care provider will take a medical history and perform a physical exam. To determine if you have mild or severe GERD, your health care provider may also monitor how you respond to treatment. You may also have tests, including:  A test to examine your stomach and esophagus with a small camera (endoscopy).  A test thatmeasures the acidity level in your esophagus.  A test thatmeasures how much pressure is on your esophagus.  A barium swallow or modified barium swallow test to show the shape, size, and functioning of your esophagus. How is this treated? The goal of treatment is to help relieve your symptoms and to prevent complications. Treatment for this condition may vary depending on how severe your symptoms are. Your health  care provider may recommend:  Changes to your diet.  Medicine.  Surgery. Follow these instructions at home: Eating and drinking   Follow a diet as recommended by your health care provider. This may involve avoiding foods and drinks such as: ? Coffee and tea (with or without caffeine). ? Drinks that containalcohol. ? Energy drinks and sports drinks. ? Carbonated drinks or sodas. ? Chocolate and cocoa. ? Peppermint and mint flavorings. ? Garlic and onions. ? Horseradish. ? Spicy and acidic foods, including peppers, chili powder, curry powder, vinegar, hot sauces, and barbecue sauce. ? Citrus fruit juices and citrus fruits, such as oranges, lemons, and limes. ? Tomato-based foods, such as red sauce, chili, salsa, and pizza with red sauce. ? Fried and fatty foods, such as donuts, french fries, potato chips, and high-fat dressings. ? High-fat meats, such as hot dogs and fatty cuts of red and white meats, such as rib eye steak, sausage, ham, and bacon. ? High-fat dairy items, such as whole milk, butter, and cream cheese.  Eat small, frequent meals instead of large meals.  Avoid drinking large amounts of liquid with your meals.  Avoid eating meals during the 2-3 hours before bedtime.  Avoid lying down right after you eat.  Do not exercise right after you eat. Lifestyle   Do not use any products that contain nicotine or tobacco, such as cigarettes, e-cigarettes, and chewing tobacco. If you need help quitting, ask your health care provider.  Try to reduce your stress by using methods such as yoga or meditation. If you need help reducing stress, ask your health care provider.  If you are overweight, reduce your weight to an amount that is healthy for you. Ask your health care provider for guidance about a safe weight loss goal. General instructions  Pay attention to any changes in your symptoms.  Take over-the-counter and prescription medicines only as told by your health care  provider. Do not take aspirin, ibuprofen, or other NSAIDs unless your health care provider told you to do so.  Wear loose-fitting clothing. Do not wear anything tight around your waist that causes pressure on your abdomen.  Raise (elevate) the head of your bed about 6 inches (15 cm).  Avoid bending over if this makes your symptoms worse.  Keep all follow-up visits as told by your health care provider. This is important. Contact a health care provider if:  You have: ? New symptoms. ? Unexplained weight loss. ? Difficulty swallowing or it hurts to swallow. ? Wheezing or a persistent cough. ? A hoarse voice.  Your symptoms do not improve with treatment. Get help right away if you:  Have pain in your arms, neck, jaw, teeth, or back.  Feel sweaty, dizzy, or light-headed.  Have chest pain or shortness of breath.  Vomit and your vomit looks like blood or coffee grounds.  Faint.  Have stool that is bloody or black.  Cannot swallow, drink, or eat. Summary  Gastroesophageal reflux happens when acid from the stomach flows up into the esophagus. GERD is a disease in which the reflux happens often, causes frequent or severe symptoms, or causes problems  such as damage to the esophagus.  Treatment for this condition may vary depending on how severe your symptoms are. Your health care provider may recommend diet and lifestyle changes, medicine, or surgery.  Contact a health care provider if you have new or worsening symptoms.  Take over-the-counter and prescription medicines only as told by your health care provider. Do not take aspirin, ibuprofen, or other NSAIDs unless your health care provider told you to do so.  Keep all follow-up visits as told by your health care provider. This is important. This information is not intended to replace advice given to you by your health care provider. Make sure you discuss any questions you have with your health care provider. Document Released:  11/24/2004 Document Revised: 08/23/2017 Document Reviewed: 08/23/2017 Elsevier Patient Education  [place].  Abdominal Bloating When you have abdominal bloating, your abdomen may feel full, tight, or painful. It may also look bigger than normal or swollen (distended). Common causes of abdominal bloating include:  Swallowing air.  Constipation.  Problems digesting food.  Eating too much.  Irritable bowel syndrome. This is a condition that affects the large intestine.  Lactose intolerance. This is an inability to digest lactose, a natural sugar in dairy products.  Celiac disease. This is a condition that affects the ability to digest gluten, a protein found in some grains.  Gastroparesis. This is a condition that slows down the movement of food in the stomach and small intestine. It is more common in people with diabetes mellitus.  Gastroesophageal reflux disease (GERD). This is a digestive condition that makes stomach acid flow back into the esophagus.  Urinary retention. This means that the body is holding onto urine, and the bladder cannot be emptied all the way. Follow these instructions at home: Eating and drinking  Avoid eating too much.  Try not to swallow air while talking or eating.  Avoid eating while lying down.  Avoid these foods and drinks: ? Foods that cause gas, such as broccoli, cabbage, cauliflower, and baked beans. ? Carbonated drinks. ? Hard candy. ? Chewing gum. Medicines  Take over-the-counter and prescription medicines only as told by your health care provider.  Take probiotic medicines. These medicines contain live bacteria or yeasts that can help digestion.  Take coated peppermint oil capsules. Activity  Try to exercise regularly. Exercise may help to relieve bloating that is caused by gas and relieve constipation. General instructions  Keep all follow-up visits as told by your health care provider. This is important. Contact a  health care provider if:  You have nausea and vomiting.  You have diarrhea.  You have abdominal pain.  You have unusual weight loss or weight gain.  You have severe pain, and medicines do not help. Get help right away if:  You have severe chest pain.  You have trouble breathing.  You have shortness of breath.  You have trouble urinating.  You have darker urine than normal.  You have blood in your stools or have dark, tarry stools. Summary  Abdominal bloating means that the abdomen is swollen.  Common causes of abdominal bloating are swallowing air, constipation, and problems digesting food.  Avoid eating too much and avoid swallowing air.  Avoid foods that cause gas, carbonated drinks, hard candy, and chewing gum. This information is not intended to replace advice given to you by your health care provider. Make sure you discuss any questions you have with your health care provider. Document Released: 03/18/2016 Document Revised: 06/04/2018 Document  Reviewed: 03/18/2016 Elsevier Patient Education  [employer].

## 2019-03-05 DIAGNOSIS — Z1331 Encounter for screening for depression: Secondary | ICD-10-CM | POA: Diagnosis not present

## 2019-03-05 DIAGNOSIS — Z7189 Other specified counseling: Secondary | ICD-10-CM | POA: Diagnosis not present

## 2019-03-05 DIAGNOSIS — Z1339 Encounter for screening examination for other mental health and behavioral disorders: Secondary | ICD-10-CM | POA: Diagnosis not present

## 2019-03-05 DIAGNOSIS — Z6832 Body mass index (BMI) 32.0-32.9, adult: Secondary | ICD-10-CM | POA: Diagnosis not present

## 2019-03-05 DIAGNOSIS — Z0000 Encounter for general adult medical examination without abnormal findings: Secondary | ICD-10-CM | POA: Diagnosis not present

## 2019-03-05 DIAGNOSIS — Z299 Encounter for prophylactic measures, unspecified: Secondary | ICD-10-CM | POA: Diagnosis not present

## 2019-03-05 DIAGNOSIS — Z87891 Personal history of nicotine dependence: Secondary | ICD-10-CM | POA: Diagnosis not present

## 2019-03-05 DIAGNOSIS — E7800 Pure hypercholesterolemia, unspecified: Secondary | ICD-10-CM | POA: Diagnosis not present

## 2019-03-05 DIAGNOSIS — Z1211 Encounter for screening for malignant neoplasm of colon: Secondary | ICD-10-CM | POA: Diagnosis not present

## 2019-03-05 DIAGNOSIS — N182 Chronic kidney disease, stage 2 (mild): Secondary | ICD-10-CM | POA: Diagnosis not present

## 2019-03-05 DIAGNOSIS — E039 Hypothyroidism, unspecified: Secondary | ICD-10-CM | POA: Diagnosis not present

## 2019-04-16 ENCOUNTER — Telehealth: Payer: Self-pay

## 2019-04-16 NOTE — Telephone Encounter (Signed)
PA was submitted through [email or web address] for Trulance. Pt has done Trulance off and on throughout the years. Last ov she was holding it for now. PA was completed so pt will be able to purchase as needed throughout the year when discussed further with the provider. Waiting on an approval or denial.

## 2019-05-06 DIAGNOSIS — G514 Facial myokymia: Secondary | ICD-10-CM | POA: Diagnosis not present

## 2019-05-06 DIAGNOSIS — H35371 Puckering of macula, right eye: Secondary | ICD-10-CM | POA: Diagnosis not present

## 2019-05-06 DIAGNOSIS — Z0100 Encounter for examination of eyes and vision without abnormal findings: Secondary | ICD-10-CM | POA: Diagnosis not present

## 2019-05-06 DIAGNOSIS — H524 Presbyopia: Secondary | ICD-10-CM | POA: Diagnosis not present

## 2019-05-06 DIAGNOSIS — Z961 Presence of intraocular lens: Secondary | ICD-10-CM | POA: Diagnosis not present

## 2019-05-07 DIAGNOSIS — Z79899 Other long term (current) drug therapy: Secondary | ICD-10-CM | POA: Diagnosis not present

## 2019-05-07 DIAGNOSIS — E2839 Other primary ovarian failure: Secondary | ICD-10-CM | POA: Diagnosis not present

## 2019-05-07 DIAGNOSIS — M859 Disorder of bone density and structure, unspecified: Secondary | ICD-10-CM | POA: Diagnosis not present

## 2019-05-15 NOTE — Telephone Encounter (Signed)
PA was approved for Trulance. Approval letter will be scanned in pts chart.

## 2019-05-20 DIAGNOSIS — I739 Peripheral vascular disease, unspecified: Secondary | ICD-10-CM | POA: Diagnosis not present

## 2019-05-20 DIAGNOSIS — R109 Unspecified abdominal pain: Secondary | ICD-10-CM | POA: Diagnosis not present

## 2019-05-20 DIAGNOSIS — M79605 Pain in left leg: Secondary | ICD-10-CM | POA: Diagnosis not present

## 2019-05-20 DIAGNOSIS — Z299 Encounter for prophylactic measures, unspecified: Secondary | ICD-10-CM | POA: Diagnosis not present

## 2019-05-20 DIAGNOSIS — J439 Emphysema, unspecified: Secondary | ICD-10-CM | POA: Diagnosis not present

## 2019-05-21 ENCOUNTER — Other Ambulatory Visit (HOSPITAL_COMMUNITY): Payer: Self-pay | Admitting: Student

## 2019-05-21 ENCOUNTER — Other Ambulatory Visit: Payer: Self-pay | Admitting: Student

## 2019-05-21 ENCOUNTER — Other Ambulatory Visit: Payer: Self-pay | Admitting: Internal Medicine

## 2019-05-21 DIAGNOSIS — M79605 Pain in left leg: Secondary | ICD-10-CM

## 2019-05-22 ENCOUNTER — Other Ambulatory Visit: Payer: Self-pay

## 2019-05-22 ENCOUNTER — Ambulatory Visit (HOSPITAL_COMMUNITY)
Admission: RE | Admit: 2019-05-22 | Discharge: 2019-05-22 | Disposition: A | Discharge status: Home / Self Care | Payer: Medicare HMO | Source: Ambulatory Visit | Attending: Internal Medicine | Admitting: Internal Medicine

## 2019-05-22 DIAGNOSIS — M79605 Pain in left leg: Secondary | ICD-10-CM | POA: Diagnosis not present

## 2019-05-22 DIAGNOSIS — M79662 Pain in left lower leg: Secondary | ICD-10-CM | POA: Diagnosis not present

## 2019-06-13 ENCOUNTER — Encounter: Payer: Self-pay | Admitting: Internal Medicine

## 2019-06-13 ENCOUNTER — Ambulatory Visit: Payer: Medicare HMO | Attending: Internal Medicine

## 2019-06-13 DIAGNOSIS — Z23 Encounter for immunization: Secondary | ICD-10-CM

## 2019-06-13 NOTE — Progress Notes (Signed)
   Covid-19 Vaccination Clinic  Name:  [name]    MRN: [id number] DOB: 10-07-1945  06/13/2019  [name] was observed post Covid-19 immunization for 15 minutes without incident. She was provided with Vaccine Information Sheet and instruction to access the V-Safe system.   [name] was instructed to call 911 with any severe reactions post vaccine: Marland Kitchen Difficulty breathing  . Swelling of face and throat  . A fast heartbeat  . A bad rash all over body  . Dizziness and weakness   Immunizations Administered    Name Date Dose VIS Date Route   Moderna COVID-19 Vaccine 06/13/2019 10:07 AM 0.5 mL 01/29/2019 Intramuscular   Manufacturer: Moderna   Lot: [id number]   [id number]

## 2019-06-26 DIAGNOSIS — R69 Illness, unspecified: Secondary | ICD-10-CM | POA: Diagnosis not present

## 2019-06-26 DIAGNOSIS — J309 Allergic rhinitis, unspecified: Secondary | ICD-10-CM | POA: Diagnosis not present

## 2019-06-26 DIAGNOSIS — G250 Essential tremor: Secondary | ICD-10-CM | POA: Diagnosis not present

## 2019-06-26 DIAGNOSIS — Z6833 Body mass index (BMI) 33.0-33.9, adult: Secondary | ICD-10-CM | POA: Diagnosis not present

## 2019-06-26 DIAGNOSIS — K219 Gastro-esophageal reflux disease without esophagitis: Secondary | ICD-10-CM | POA: Diagnosis not present

## 2019-06-26 DIAGNOSIS — E039 Hypothyroidism, unspecified: Secondary | ICD-10-CM | POA: Diagnosis not present

## 2019-06-26 DIAGNOSIS — E669 Obesity, unspecified: Secondary | ICD-10-CM | POA: Diagnosis not present

## 2019-06-26 DIAGNOSIS — R030 Elevated blood-pressure reading, without diagnosis of hypertension: Secondary | ICD-10-CM | POA: Diagnosis not present

## 2019-06-26 DIAGNOSIS — R32 Unspecified urinary incontinence: Secondary | ICD-10-CM | POA: Diagnosis not present

## 2019-06-26 DIAGNOSIS — K589 Irritable bowel syndrome without diarrhea: Secondary | ICD-10-CM | POA: Diagnosis not present

## 2019-08-01 ENCOUNTER — Telehealth: Payer: Self-pay | Admitting: *Deleted

## 2019-08-01 NOTE — Telephone Encounter (Signed)
Pt wants to see if she can get 90 day supply of Trulance and Pantoprazole sent to [facility].  Pt made ov for 09/27/19.

## 2019-08-02 MED ORDER — TRULANCE 3 MG PO TABS: 1.0000 | ORAL_TABLET | Freq: Every day | ORAL | 3 refills | Status: DC

## 2019-08-02 NOTE — Addendum Note (Signed)
Addended by: Annitta Needs on: 08/02/2019 08:46 AM   Modules accepted: Orders

## 2019-08-02 NOTE — Telephone Encounter (Signed)
Done

## 2019-08-07 DIAGNOSIS — R69 Illness, unspecified: Secondary | ICD-10-CM | POA: Diagnosis not present

## 2019-08-11 ENCOUNTER — Other Ambulatory Visit: Payer: Self-pay | Admitting: Gastroenterology

## 2019-08-15 DIAGNOSIS — Z1231 Encounter for screening mammogram for malignant neoplasm of breast: Secondary | ICD-10-CM | POA: Diagnosis not present

## 2019-08-22 DIAGNOSIS — E039 Hypothyroidism, unspecified: Secondary | ICD-10-CM | POA: Diagnosis not present

## 2019-08-22 DIAGNOSIS — J069 Acute upper respiratory infection, unspecified: Secondary | ICD-10-CM | POA: Diagnosis not present

## 2019-08-22 DIAGNOSIS — J439 Emphysema, unspecified: Secondary | ICD-10-CM | POA: Diagnosis not present

## 2019-08-22 DIAGNOSIS — Z789 Other specified health status: Secondary | ICD-10-CM | POA: Diagnosis not present

## 2019-08-22 DIAGNOSIS — Z299 Encounter for prophylactic measures, unspecified: Secondary | ICD-10-CM | POA: Diagnosis not present

## 2019-08-22 DIAGNOSIS — R69 Illness, unspecified: Secondary | ICD-10-CM | POA: Diagnosis not present

## 2019-09-03 DIAGNOSIS — R69 Illness, unspecified: Secondary | ICD-10-CM | POA: Diagnosis not present

## 2019-09-03 DIAGNOSIS — E039 Hypothyroidism, unspecified: Secondary | ICD-10-CM | POA: Diagnosis not present

## 2019-09-03 DIAGNOSIS — I739 Peripheral vascular disease, unspecified: Secondary | ICD-10-CM | POA: Diagnosis not present

## 2019-09-03 DIAGNOSIS — E7800 Pure hypercholesterolemia, unspecified: Secondary | ICD-10-CM | POA: Diagnosis not present

## 2019-09-03 DIAGNOSIS — Z299 Encounter for prophylactic measures, unspecified: Secondary | ICD-10-CM | POA: Diagnosis not present

## 2019-09-03 DIAGNOSIS — J439 Emphysema, unspecified: Secondary | ICD-10-CM | POA: Diagnosis not present

## 2019-09-27 ENCOUNTER — Ambulatory Visit (INDEPENDENT_AMBULATORY_CARE_PROVIDER_SITE_OTHER): Payer: Medicare HMO | Admitting: Gastroenterology

## 2019-09-27 ENCOUNTER — Encounter: Payer: Self-pay | Admitting: Gastroenterology

## 2019-09-27 ENCOUNTER — Telehealth: Payer: Self-pay | Admitting: *Deleted

## 2019-09-27 DIAGNOSIS — K219 Gastro-esophageal reflux disease without esophagitis: Secondary | ICD-10-CM | POA: Diagnosis not present

## 2019-09-27 DIAGNOSIS — K5900 Constipation, unspecified: Secondary | ICD-10-CM | POA: Diagnosis not present

## 2019-09-27 NOTE — Patient Instructions (Signed)
Continue Trulance as you are doing.  Continue Protonix once daily.  We will see you in January 2022! Colonoscopy in February 2022!  I enjoyed talking with you again today! As you know, I value our relationship and want to provide genuine, compassionate, and quality care. I welcome your feedback. If you receive a survey regarding your visit,  I greatly appreciate you taking time to fill this out. See you next time!  Annitta Needs, PhD, ANP-BC [facility]

## 2019-09-27 NOTE — Telephone Encounter (Signed)
Pt consented to a telephone visit. °

## 2019-09-27 NOTE — Progress Notes (Signed)
Primary Care Physician:  Glenda Chroman, MD  Primary GI: Dr. Gala Romney   Patient Location: Home   Provider Location: [facility] office   Reason for Visit: Follow-up    Persons present on the virtual encounter, with roles: NP and patient    Total time (minutes) spent on medical discussion: 10 minutes   Due to COVID-19, visit was conducted using virtual method.  Visit was requested by patient.  Virtual Visit via Telephone Note Due to COVID-19, visit is conducted virtually and was requested by patient.   I connected with [name] on 09/27/19 at  8:30 AM EDT by telephone and verified that I am speaking with the correct person using two identifiers.   I discussed the limitations, risks, security and privacy concerns of performing an evaluation and management service by telephone and the availability of in person appointments. I also discussed with the patient that there may be a patient responsible charge related to this service. The patient expressed understanding and agreed to proceed.  Chief Complaint  Patient presents with  . Gastroesophageal Reflux    ok  . Diarrhea    occ, under stress (husband recently passed away)     History of Present Illness: 74 year old pleasant  female with history of chronic constipation and GERD, presenting virtually via phone call for follow-up. Due for colonoscopy in 2022.   Constipation at times but not a lot. Looser the last few weeks. Lost her husband on Father's Day. Trulance once daily. Bentyl only rare occasions if going out to eat as she will have looser stool.   GERD: taking Protonix. No N/V. No dysphagia. No abdominal pain. No overt GI bleeding. Appetite is so-so since husband died. No weight loss. Has a good support system with family.   Past Medical History:  Diagnosis Date  . Arthritis   . Depression   . Diverticulitis   . GERD (gastroesophageal reflux disease)   . Heart murmur    MILD, NO CARDIOLOGIST  . Hyperlipidemia   .  Hypothyroidism   . IBS (irritable bowel syndrome)   . Pneumonia   . Primary localized osteoarthritis of right hip 11/11/2014  . Shortness of breath    with exertion  . Umbilical hernia      Past Surgical History:  Procedure Laterality Date  . ABDOMINAL HYSTERECTOMY  2003   OVARIES REMOVED  . CATARACT EXTRACTION W/PHACO Left 05/03/2012   Procedure: CATARACT EXTRACTION PHACO AND INTRAOCULAR LENS PLACEMENT (IOC);  Surgeon: Tonny Branch, MD;  Location: [facility];  Service: Ophthalmology;  Laterality: Left;  CDE: 16.80  . CATARACT EXTRACTION W/PHACO Right 05/21/2012   Procedure: CATARACT EXTRACTION PHACO AND INTRAOCULAR LENS PLACEMENT (IOC);  Surgeon: Tonny Branch, MD;  Location: [facility];  Service: Ophthalmology;  Laterality: Right;  CDE=12.31  . [place]  . CHOLECYSTECTOMY  01/10/2011   Procedure: LAPAROSCOPIC CHOLECYSTECTOMY;  Surgeon: Scherry Ran;  Location: [facility];  Service: General;  Laterality: N/A;  . COLONOSCOPY  2011   Dr. Arnoldo Morale: sigmoid diverticulosis, repeat Aug 2016 due to history of adenomatous polyps  . COLONOSCOPY N/A 02/04/2013   Dr. Gala Romney :Colonic diverticulosis, repeat in 2019  . COLONOSCOPY N/A 04/09/2015   Dr. Gala Romney: tubular adenoma and diverticulosis. Surveillance in 5 years   . ESOPHAGOGASTRODUODENOSCOPY  2011   Dr. Arnoldo Morale: multiple gastric polyps, path with mild chronic gastritis  . ESOPHAGOGASTRODUODENOSCOPY N/A 02/04/2013   Dr. Rourk:Small hiatal hernia. Gastric polyps s/p bx, negative H.pylori. Benign path.   Marland Kitchen  EUS N/A 08/29/2013   Dr. Ardis Hughs: no clear cause for acute pancreatitis. Autoimmune labs normal. Question HCTZ as culprit  . TOTAL HIP ARTHROPLASTY Right 11/11/2014   Procedure: TOTAL RIGHT HIP ARTHROPLASTY;  Surgeon: Marchia Bond, MD;  Location: [place];  Service: Orthopedics;  Laterality: Right;  . TUBAL LIGATION  1970's     Current Meds  Medication Sig  . acetaminophen (TYLENOL) 650 MG CR tablet Take 1,300 mg by mouth every 8 (eight) hours as  needed for pain.  Marland Kitchen dicyclomine (BENTYL) 10 MG capsule Take 1 capsule (10 mg total) by mouth as needed for spasms. 30 minutes before going out to eat.  . Diphenhydramine-APAP, sleep, (UNISOM PM PAIN PO) Take at bedtime by mouth.  . escitalopram (LEXAPRO) 10 MG tablet Take 1 tablet by mouth daily.  Marland Kitchen ibuprofen (ADVIL,MOTRIN) 200 MG tablet Take 400 mg by mouth every 6 (six) hours as needed.  Marland Kitchen levothyroxine (SYNTHROID) 100 MCG tablet Take 100 mcg by mouth daily.  Marland Kitchen loperamide (IMODIUM) 2 MG capsule Take 2 mg by mouth as needed for diarrhea or loose stools.  . Magnesium 250 MG TABS Take by mouth daily.  Marland Kitchen OVER THE COUNTER MEDICATION Vitafusion 2 by mouth once a day  . pantoprazole (PROTONIX) 40 MG tablet TAKE 1 TABLET BY MOUTH EVERY DAY  . Plecanatide (TRULANCE) 3 MG TABS Take 1 tablet by mouth daily.  . primidone (MYSOLINE) 50 MG tablet Take 50 mg by mouth daily.  . Probiotic Product (PROBIOTIC DAILY PO) Take by mouth daily.  . simvastatin (ZOCOR) 10 MG tablet Take 10 mg by mouth at bedtime.     Family History  Problem Relation Age of Onset  . Anesthesia problems Neg Hx   . Hypotension Neg Hx   . Malignant hyperthermia Neg Hx   . Pseudochol deficiency Neg Hx   . Colon cancer Neg Hx   . Pancreatitis Neg Hx     Social History   Socioeconomic History  . Marital status: Married    Spouse name: Not on file  . Number of children: Not on file  . Years of education: Not on file  . Highest education level: Not on file  Occupational History  . Not on file  Tobacco Use  . Smoking status: Never Smoker  . Smokeless tobacco: Never Used  Substance and Sexual Activity  . Alcohol use: No  . Drug use: No  . Sexual activity: Yes    Birth control/protection: None  Other Topics Concern  . Not on file  Social History Narrative  . Not on file   Social Determinants of Health   Financial Resource Strain:   . Difficulty of Paying Living Expenses:   Food Insecurity:   . Worried About Paediatric nurse in the Last Year:   . Arboriculturist in the Last Year:   Transportation Needs:   . Film/video editor (Medical):   Marland Kitchen Lack of Transportation (Non-Medical):   Physical Activity:   . Days of Exercise per Week:   . Minutes of Exercise per Session:   Stress:   . Feeling of Stress :   Social Connections:   . Frequency of Communication with Friends and Family:   . Frequency of Social Gatherings with Friends and Family:   . Attends Religious Services:   . Active Member of Clubs or Organizations:   . Attends Archivist Meetings:   Marland Kitchen Marital Status:        Review of Systems:  Gen: Denies fever, chills, anorexia. Denies fatigue, weakness, weight loss.  CV: Denies chest pain, palpitations, syncope, peripheral edema, and claudication. Resp: Denies dyspnea at rest, cough, wheezing, coughing up blood, and pleurisy. GI: see HPI Derm: Denies rash, itching, dry skin Psych: Denies depression, anxiety, memory loss, confusion. No homicidal or suicidal ideation.  Heme: Denies bruising, bleeding, and enlarged lymph nodes.  Observations/Objective: No distress. Unable to perform physical exam due to telephone encounter. No video available.   Assessment and Plan: Very pleasant 74 year old female with history of GERD and constipation, doing well on Trulance and Protonix daily. She will very rarely need to take a dose of dicyclomine if going out to eat due to postprandial urgency; however, she is aware this could contribute to constipation and does not take routinely.   We will see her back in Jan 2022 to arrange surveillance colonoscopy.   Follow Up Instructions: See AVS   I discussed the assessment and treatment plan with the patient. The patient was provided an opportunity to ask questions and all were answered. The patient agreed with the plan and demonstrated an understanding of the instructions.   The patient was advised to call back or seek an in-person evaluation if  the symptoms worsen or if the condition fails to improve as anticipated.  I provided 10 minutes of non-face-to-face time during this encounter.  Annitta Needs, PhD, ANP-BC [facility]

## 2019-09-27 NOTE — Telephone Encounter (Signed)
[name], you are scheduled for a virtual visit with your provider today.  Just as we do with appointments in the office, we must obtain your consent to participate.  Your consent will be active for this visit and any virtual visit you may have with one of our providers in the next 365 days.  If you have a MyChart account, I can also send a copy of this consent to you electronically.  All virtual visits are billed to your insurance company just like a traditional visit in the office.  As this is a virtual visit, video technology does not allow for your provider to perform a traditional examination.  This may limit your provider's ability to fully assess your condition.  If your provider identifies any concerns that need to be evaluated in person or the need to arrange testing such as labs, EKG, etc, we will make arrangements to do so.  Although advances in technology are sophisticated, we cannot ensure that it will always work on either your end or our end.  If the connection with a video visit is poor, we may have to switch to a telephone visit.  With either a video or telephone visit, we are not always able to ensure that we have a secure connection.   I need to obtain your verbal consent now.   Are you willing to proceed with your visit today?

## 2019-11-06 DIAGNOSIS — E039 Hypothyroidism, unspecified: Secondary | ICD-10-CM | POA: Diagnosis not present

## 2019-12-26 ENCOUNTER — Other Ambulatory Visit: Payer: Self-pay | Admitting: Gastroenterology

## 2019-12-26 DIAGNOSIS — R079 Chest pain, unspecified: Secondary | ICD-10-CM | POA: Diagnosis not present

## 2019-12-26 DIAGNOSIS — K219 Gastro-esophageal reflux disease without esophagitis: Secondary | ICD-10-CM | POA: Diagnosis not present

## 2019-12-26 DIAGNOSIS — I739 Peripheral vascular disease, unspecified: Secondary | ICD-10-CM | POA: Diagnosis not present

## 2019-12-26 DIAGNOSIS — J439 Emphysema, unspecified: Secondary | ICD-10-CM | POA: Diagnosis not present

## 2019-12-26 DIAGNOSIS — Z23 Encounter for immunization: Secondary | ICD-10-CM | POA: Diagnosis not present

## 2019-12-26 DIAGNOSIS — Z299 Encounter for prophylactic measures, unspecified: Secondary | ICD-10-CM | POA: Diagnosis not present

## 2020-01-10 DIAGNOSIS — Z299 Encounter for prophylactic measures, unspecified: Secondary | ICD-10-CM | POA: Diagnosis not present

## 2020-01-10 DIAGNOSIS — E039 Hypothyroidism, unspecified: Secondary | ICD-10-CM | POA: Diagnosis not present

## 2020-01-10 DIAGNOSIS — K219 Gastro-esophageal reflux disease without esophagitis: Secondary | ICD-10-CM | POA: Diagnosis not present

## 2020-01-10 DIAGNOSIS — R69 Illness, unspecified: Secondary | ICD-10-CM | POA: Diagnosis not present

## 2020-01-10 DIAGNOSIS — J439 Emphysema, unspecified: Secondary | ICD-10-CM | POA: Diagnosis not present

## 2020-01-29 ENCOUNTER — Encounter: Payer: Self-pay | Admitting: Cardiology

## 2020-01-29 DIAGNOSIS — R072 Precordial pain: Secondary | ICD-10-CM | POA: Insufficient documentation

## 2020-01-29 DIAGNOSIS — E785 Hyperlipidemia, unspecified: Secondary | ICD-10-CM | POA: Insufficient documentation

## 2020-01-29 NOTE — Progress Notes (Deleted)
Cardiology Office Note   Date:  01/29/2020   ID:  [name], DOB Jul 31, 1945, MRN [id number]  PCP:  Glenda Chroman, MD  Cardiologist:   No primary care provider on file. Referring:  ***  No chief complaint on file.     History of Present Illness: [name] is a 74 y.o. female who is referred by *** for evaluation of chest pain.  She is new to our practice.  She did have a stress test and echo in 2015 but I do not have these results.  I reviewed records sent from her primary care office for this visit.  ***   Past Medical History:  Diagnosis Date  . Arthritis   . Depression   . Diverticulitis   . GERD (gastroesophageal reflux disease)   . Heart murmur    MILD, NO CARDIOLOGIST  . Hyperlipidemia   . Hypothyroidism   . IBS (irritable bowel syndrome)   . Pneumonia   . Primary localized osteoarthritis of right hip 11/11/2014  . Shortness of breath    with exertion  . Umbilical hernia     Past Surgical History:  Procedure Laterality Date  . ABDOMINAL HYSTERECTOMY  2003   OVARIES REMOVED  . CATARACT EXTRACTION W/PHACO Left 05/03/2012   Procedure: CATARACT EXTRACTION PHACO AND INTRAOCULAR LENS PLACEMENT (IOC);  Surgeon: Tonny Branch, MD;  Location: [facility];  Service: Ophthalmology;  Laterality: Left;  CDE: 16.80  . CATARACT EXTRACTION W/PHACO Right 05/21/2012   Procedure: CATARACT EXTRACTION PHACO AND INTRAOCULAR LENS PLACEMENT (IOC);  Surgeon: Tonny Branch, MD;  Location: [facility];  Service: Ophthalmology;  Laterality: Right;  CDE=12.31  . [place]  . CHOLECYSTECTOMY  01/10/2011   Procedure: LAPAROSCOPIC CHOLECYSTECTOMY;  Surgeon: Scherry Ran;  Location: [facility];  Service: General;  Laterality: N/A;  . COLONOSCOPY  2011   Dr. Arnoldo Morale: sigmoid diverticulosis, repeat Aug 2016 due to history of adenomatous polyps  . COLONOSCOPY N/A 02/04/2013   Dr. Gala Romney :Colonic diverticulosis, repeat in 2019  . COLONOSCOPY N/A 04/09/2015   Dr. Gala Romney: tubular adenoma  and diverticulosis. Surveillance in 5 years   . ESOPHAGOGASTRODUODENOSCOPY  2011   Dr. Arnoldo Morale: multiple gastric polyps, path with mild chronic gastritis  . ESOPHAGOGASTRODUODENOSCOPY N/A 02/04/2013   Dr. Rourk:Small hiatal hernia. Gastric polyps s/p bx, negative H.pylori. Benign path.   . EUS N/A 08/29/2013   Dr. Ardis Hughs: no clear cause for acute pancreatitis. Autoimmune labs normal. Question HCTZ as culprit  . TOTAL HIP ARTHROPLASTY Right 11/11/2014   Procedure: TOTAL RIGHT HIP ARTHROPLASTY;  Surgeon: Marchia Bond, MD;  Location: [facility];  Service: Orthopedics;  Laterality: Right;  . TUBAL LIGATION  1970's     Current Outpatient Medications  Medication Sig Dispense Refill  . acetaminophen (TYLENOL) 650 MG CR tablet Take 1,300 mg by mouth every 8 (eight) hours as needed for pain.    Marland Kitchen dicyclomine (BENTYL) 10 MG capsule TAKE ONE CAPSULE BY MOUTH AS NEEDED FOR SPASMS 30 MINUTES BEFORE GOING OUT TO EAT 30 capsule 1  . Diphenhydramine-APAP, sleep, (UNISOM PM PAIN PO) Take at bedtime by mouth.    . escitalopram (LEXAPRO) 10 MG tablet Take 1 tablet by mouth daily.    Marland Kitchen ibuprofen (ADVIL,MOTRIN) 200 MG tablet Take 400 mg by mouth every 6 (six) hours as needed.    Marland Kitchen levothyroxine (SYNTHROID) 100 MCG tablet Take 100 mcg by mouth daily.    Marland Kitchen loperamide (IMODIUM) 2 MG capsule Take 2 mg by mouth  as needed for diarrhea or loose stools.    . Magnesium 250 MG TABS Take by mouth daily.    Marland Kitchen OVER THE COUNTER MEDICATION Vitafusion 2 by mouth once a day    . pantoprazole (PROTONIX) 40 MG tablet TAKE 1 TABLET BY MOUTH EVERY DAY 90 tablet 3  . Plecanatide (TRULANCE) 3 MG TABS Take 1 tablet by mouth daily. 90 tablet 3  . primidone (MYSOLINE) 50 MG tablet Take 50 mg by mouth daily.    . Probiotic Product (PROBIOTIC DAILY PO) Take by mouth daily.    . simvastatin (ZOCOR) 10 MG tablet Take 10 mg by mouth at bedtime.     No current facility-administered medications for this visit.    Allergies:   Metoclopramide  hcl, Aspirin, and Codeine    Social History:  The patient  reports that she has never smoked. She has never used smokeless tobacco. She reports that she does not drink alcohol and does not use drugs.   Family History:  The patient's ***family history is not on file.    ROS:  Please see the history of present illness.   Otherwise, review of systems are positive for {NONE DEFAULTED:18576::"none"}.   All other systems are reviewed and negative.    PHYSICAL EXAM: VS:  There were no vitals taken for this visit. , BMI There is no height or weight on file to calculate BMI. GENERAL:  Well appearing HEENT:  Pupils equal round and reactive, fundi not visualized, oral mucosa unremarkable NECK:  No jugular venous distention, waveform within normal limits, carotid upstroke brisk and symmetric, no bruits, no thyromegaly LYMPHATICS:  No cervical, inguinal adenopathy LUNGS:  Clear to auscultation bilaterally BACK:  No CVA tenderness CHEST:  Unremarkable HEART:  PMI not displaced or sustained,S1 and S2 within normal limits, no S3, no S4, no clicks, no rubs, *** murmurs ABD:  Flat, positive bowel sounds normal in frequency in pitch, no bruits, no rebound, no guarding, no midline pulsatile mass, no hepatomegaly, no splenomegaly EXT:  2 plus pulses throughout, no edema, no cyanosis no clubbing SKIN:  No rashes no nodules NEURO:  Cranial nerves II through XII grossly intact, motor grossly intact throughout PSYCH:  Cognitively intact, oriented to person place and time    EKG:  EKG {ACTION; IS/IS KYH:[id number]} ordered today. The ekg ordered today demonstrates ***   Recent Labs: No results found for requested labs within last 8760 hours.    Lipid Panel    Component Value Date/Time   TRIG 134 08/09/2013 0535      Wt Readings from Last 3 Encounters:  03/29/18 184 lb 12.8 oz (83.8 kg)  01/12/17 181 lb 9.6 oz (82.4 kg)  06/06/16 181 lb 9.6 oz (82.4 kg)      Other studies Reviewed: Additional  studies/ records that were reviewed today include: ***. Review of the above records demonstrates:  Please see elsewhere in the note.  ***   ASSESSMENT AND PLAN:  Chest pain:  ***  Dyslipidemia:  ***    Current medicines are reviewed at length with the patient today.  The patient {ACTIONS; HAS/DOES NOT HAVE:19233} concerns regarding medicines.  The following changes have been made:  {PLAN; NO CHANGE:13088:s}  Labs/ tests ordered today include: *** No orders of the defined types were placed in this encounter.    Disposition:   FU with ***    Signed, Minus Breeding, MD  01/29/2020 8:35 PM    [place]

## 2020-01-31 ENCOUNTER — Ambulatory Visit: Payer: Medicare HMO | Admitting: Cardiology

## 2020-01-31 DIAGNOSIS — R072 Precordial pain: Secondary | ICD-10-CM

## 2020-01-31 DIAGNOSIS — E039 Hypothyroidism, unspecified: Secondary | ICD-10-CM | POA: Diagnosis not present

## 2020-01-31 DIAGNOSIS — E785 Hyperlipidemia, unspecified: Secondary | ICD-10-CM

## 2020-01-31 DIAGNOSIS — J069 Acute upper respiratory infection, unspecified: Secondary | ICD-10-CM | POA: Diagnosis not present

## 2020-01-31 DIAGNOSIS — I739 Peripheral vascular disease, unspecified: Secondary | ICD-10-CM | POA: Diagnosis not present

## 2020-01-31 DIAGNOSIS — R69 Illness, unspecified: Secondary | ICD-10-CM | POA: Diagnosis not present

## 2020-01-31 DIAGNOSIS — Z299 Encounter for prophylactic measures, unspecified: Secondary | ICD-10-CM | POA: Diagnosis not present

## 2020-02-18 ENCOUNTER — Ambulatory Visit (INDEPENDENT_AMBULATORY_CARE_PROVIDER_SITE_OTHER): Payer: Medicare HMO | Admitting: Cardiology

## 2020-02-18 ENCOUNTER — Encounter: Payer: Self-pay | Admitting: Cardiology

## 2020-02-18 ENCOUNTER — Encounter: Payer: Self-pay | Admitting: *Deleted

## 2020-02-18 VITALS — BP 140/90 | HR 95 | Ht 63.0 in | Wt 192.0 lb

## 2020-02-18 DIAGNOSIS — R0789 Other chest pain: Secondary | ICD-10-CM | POA: Diagnosis not present

## 2020-02-18 DIAGNOSIS — R011 Cardiac murmur, unspecified: Secondary | ICD-10-CM

## 2020-02-18 NOTE — Patient Instructions (Signed)

## 2020-02-18 NOTE — Progress Notes (Signed)
Clinical Summary [name] is a 74 y.o.female seen as new consult, referred by Dr Woody Seller for chest pain  1. Chest pain - sharp pain starting in throat down it midchest in October - had just layed down in bed. No other associated symptoms.  - pain lasted 10 minutes. No recurrence - chronic SOB unchanged - tender to palpation  pcp adjusted diet and meds, no recurrence.      SH: husband Charnae Lill former patietn of mine, passed away 08/26/19    Past Medical History:  Diagnosis Date  . Arthritis   . Depression   . Diverticulitis   . GERD (gastroesophageal reflux disease)   . Heart murmur    MILD, NO CARDIOLOGIST  . Hyperlipidemia   . Hypothyroidism   . IBS (irritable bowel syndrome)   . Primary localized osteoarthritis of right hip 11/11/2014  . Umbilical hernia      Allergies  Allergen Reactions  . Metoclopramide Hcl Other (See Comments)    "Chemical Imbalance" per pt  . Aspirin     Intolerance because of acid reflux  . Codeine Nausea Only     Current Outpatient Medications  Medication Sig Dispense Refill  . acetaminophen (TYLENOL) 650 MG CR tablet Take 1,300 mg by mouth every 8 (eight) hours as needed for pain.    Marland Kitchen dicyclomine (BENTYL) 10 MG capsule TAKE ONE CAPSULE BY MOUTH AS NEEDED FOR SPASMS 30 MINUTES BEFORE GOING OUT TO EAT 30 capsule 1  . Diphenhydramine-APAP, sleep, (UNISOM PM PAIN PO) Take at bedtime by mouth.    . escitalopram (LEXAPRO) 10 MG tablet Take 1 tablet by mouth daily.    Marland Kitchen ibuprofen (ADVIL,MOTRIN) 200 MG tablet Take 400 mg by mouth every 6 (six) hours as needed.    Marland Kitchen levothyroxine (SYNTHROID) 100 MCG tablet Take 100 mcg by mouth daily.    Marland Kitchen loperamide (IMODIUM) 2 MG capsule Take 2 mg by mouth as needed for diarrhea or loose stools.    . Magnesium 250 MG TABS Take by mouth daily.    Marland Kitchen OVER THE COUNTER MEDICATION Vitafusion 2 by mouth once a day    . pantoprazole (PROTONIX) 40 MG tablet TAKE 1 TABLET BY MOUTH EVERY DAY 90 tablet 3   . Plecanatide (TRULANCE) 3 MG TABS Take 1 tablet by mouth daily. 90 tablet 3  . primidone (MYSOLINE) 50 MG tablet Take 50 mg by mouth daily.    . Probiotic Product (PROBIOTIC DAILY PO) Take by mouth daily.    . simvastatin (ZOCOR) 10 MG tablet Take 10 mg by mouth at bedtime.     No current facility-administered medications for this visit.     Past Surgical History:  Procedure Laterality Date  . ABDOMINAL HYSTERECTOMY  2003   OVARIES REMOVED  . CATARACT EXTRACTION W/PHACO Left 05/03/2012   Procedure: CATARACT EXTRACTION PHACO AND INTRAOCULAR LENS PLACEMENT (IOC);  Surgeon: Tonny Trystyn Dolley, MD;  Location: [facility];  Service: Ophthalmology;  Laterality: Left;  CDE: 16.80  . CATARACT EXTRACTION W/PHACO Right 05/21/2012   Procedure: CATARACT EXTRACTION PHACO AND INTRAOCULAR LENS PLACEMENT (IOC);  Surgeon: Tonny Agamjot Kilgallon, MD;  Location: [facility];  Service: Ophthalmology;  Laterality: Right;  CDE=12.31  . [place]  . CHOLECYSTECTOMY  01/10/2011   Procedure: LAPAROSCOPIC CHOLECYSTECTOMY;  Surgeon: Scherry Ran;  Location: [facility];  Service: General;  Laterality: N/A;  . COLONOSCOPY  2011   Dr. Arnoldo Morale: sigmoid diverticulosis, repeat Aug 2016 due to history of adenomatous polyps  .  COLONOSCOPY N/A 02/04/2013   Dr. Gala Romney :Colonic diverticulosis, repeat in 2019  . COLONOSCOPY N/A 04/09/2015   Dr. Gala Romney: tubular adenoma and diverticulosis. Surveillance in 5 years   . ESOPHAGOGASTRODUODENOSCOPY  2011   Dr. Arnoldo Morale: multiple gastric polyps, path with mild chronic gastritis  . ESOPHAGOGASTRODUODENOSCOPY N/A 02/04/2013   Dr. Rourk:Small hiatal hernia. Gastric polyps s/p bx, negative H.pylori. Benign path.   . EUS N/A 08/29/2013   Dr. Ardis Hughs: no clear cause for acute pancreatitis. Autoimmune labs normal. Question HCTZ as culprit  . TOTAL HIP ARTHROPLASTY Right 11/11/2014   Procedure: TOTAL RIGHT HIP ARTHROPLASTY;  Surgeon: Marchia Bond, MD;  Location: [facility];  Service: Orthopedics;  Laterality: Right;   . TUBAL LIGATION  1970's     Allergies  Allergen Reactions  . Metoclopramide Hcl Other (See Comments)    "Chemical Imbalance" per pt  . Aspirin     Intolerance because of acid reflux  . Codeine Nausea Only      Family History  Problem Relation Age of Onset  . Anesthesia problems Neg Hx   . Hypotension Neg Hx   . Malignant hyperthermia Neg Hx   . Pseudochol deficiency Neg Hx   . Colon cancer Neg Hx   . Pancreatitis Neg Hx      Social History Ms. Metter reports that she has never smoked. She has never used smokeless tobacco. Ms. Radziewicz reports no history of alcohol use.   Review of Systems CONSTITUTIONAL: No weight loss, fever, chills, weakness or fatigue.  HEENT: Eyes: No visual loss, blurred vision, double vision or yellow sclerae.No hearing loss, sneezing, congestion, runny nose or sore throat.  SKIN: No rash or itching.  CARDIOVASCULAR: per hpi RESPIRATORY: No shortness of breath, cough or sputum.  GASTROINTESTINAL: No anorexia, nausea, vomiting or diarrhea. No abdominal pain or blood.  GENITOURINARY: No burning on urination, no polyuria NEUROLOGICAL: No headache, dizziness, syncope, paralysis, ataxia, numbness or tingling in the extremities. No change in bowel or bladder control.  MUSCULOSKELETAL: No muscle, back pain, joint pain or stiffness.  LYMPHATICS: No enlarged nodes. No history of splenectomy.  PSYCHIATRIC: No history of depression or anxiety.  ENDOCRINOLOGIC: No reports of sweating, cold or heat intolerance. No polyuria or polydipsia.  Marland Kitchen   Physical Examination Today's Vitals   02/18/20 1325  BP: 140/90  Pulse: 95  SpO2: 98%  Weight: 192 lb (87.1 kg)  Height: 5\' 3"  (1.6 m)   Body mass index is 34.01 kg/m.  Gen: resting comfortably, no acute distress HEENT: no scleral icterus, pupils equal round and reactive, no palptable cervical adenopathy,  CV: RRR, 2/6 systolic murmur rusb, no jvd Resp: Clear to auscultation bilaterally GI: abdomen is  soft, non-tender, non-distended, normal bowel sounds, no hepatosplenomegaly MSK: extremities are warm, no edema.  Skin: warm, no rash Neuro:  no focal deficits Psych: appropriate affect    Assessment and Plan  1. Chest pain - isolated atypical episode most consistent with GERD - no plans for ischemic testing at this time, monitor for now  2. Heart murmur - very mild murmur on exam, just monitor at this time   F/u 6 months, if doing well can push back 1 year      Arnoldo Lenis, M.D.

## 2020-02-27 ENCOUNTER — Encounter: Payer: Self-pay | Admitting: Internal Medicine

## 2020-02-28 DIAGNOSIS — E7849 Other hyperlipidemia: Secondary | ICD-10-CM | POA: Diagnosis not present

## 2020-02-28 DIAGNOSIS — E039 Hypothyroidism, unspecified: Secondary | ICD-10-CM | POA: Diagnosis not present

## 2020-03-10 DIAGNOSIS — E7800 Pure hypercholesterolemia, unspecified: Secondary | ICD-10-CM | POA: Diagnosis not present

## 2020-03-10 DIAGNOSIS — Z1331 Encounter for screening for depression: Secondary | ICD-10-CM | POA: Diagnosis not present

## 2020-03-10 DIAGNOSIS — R739 Hyperglycemia, unspecified: Secondary | ICD-10-CM | POA: Diagnosis not present

## 2020-03-10 DIAGNOSIS — Z79899 Other long term (current) drug therapy: Secondary | ICD-10-CM | POA: Diagnosis not present

## 2020-03-10 DIAGNOSIS — E039 Hypothyroidism, unspecified: Secondary | ICD-10-CM | POA: Diagnosis not present

## 2020-03-10 DIAGNOSIS — Z6832 Body mass index (BMI) 32.0-32.9, adult: Secondary | ICD-10-CM | POA: Diagnosis not present

## 2020-03-10 DIAGNOSIS — Z7189 Other specified counseling: Secondary | ICD-10-CM | POA: Diagnosis not present

## 2020-03-10 DIAGNOSIS — Z299 Encounter for prophylactic measures, unspecified: Secondary | ICD-10-CM | POA: Diagnosis not present

## 2020-03-10 DIAGNOSIS — Z0000 Encounter for general adult medical examination without abnormal findings: Secondary | ICD-10-CM | POA: Diagnosis not present

## 2020-03-10 DIAGNOSIS — Z1339 Encounter for screening examination for other mental health and behavioral disorders: Secondary | ICD-10-CM | POA: Diagnosis not present

## 2020-03-16 ENCOUNTER — Ambulatory Visit: Payer: Medicare HMO | Admitting: Cardiology

## 2020-04-21 DIAGNOSIS — K219 Gastro-esophageal reflux disease without esophagitis: Secondary | ICD-10-CM | POA: Diagnosis not present

## 2020-04-21 DIAGNOSIS — E785 Hyperlipidemia, unspecified: Secondary | ICD-10-CM | POA: Diagnosis not present

## 2020-04-21 DIAGNOSIS — G250 Essential tremor: Secondary | ICD-10-CM | POA: Diagnosis not present

## 2020-04-21 DIAGNOSIS — F419 Anxiety disorder, unspecified: Secondary | ICD-10-CM | POA: Diagnosis not present

## 2020-04-21 DIAGNOSIS — K08409 Partial loss of teeth, unspecified cause, unspecified class: Secondary | ICD-10-CM | POA: Diagnosis not present

## 2020-04-21 DIAGNOSIS — M1990 Unspecified osteoarthritis, unspecified site: Secondary | ICD-10-CM | POA: Diagnosis not present

## 2020-04-21 DIAGNOSIS — K581 Irritable bowel syndrome with constipation: Secondary | ICD-10-CM | POA: Diagnosis not present

## 2020-04-21 DIAGNOSIS — G4700 Insomnia, unspecified: Secondary | ICD-10-CM | POA: Diagnosis not present

## 2020-04-21 DIAGNOSIS — E669 Obesity, unspecified: Secondary | ICD-10-CM | POA: Diagnosis not present

## 2020-04-21 DIAGNOSIS — J309 Allergic rhinitis, unspecified: Secondary | ICD-10-CM | POA: Diagnosis not present

## 2020-04-21 DIAGNOSIS — Z008 Encounter for other general examination: Secondary | ICD-10-CM | POA: Diagnosis not present

## 2020-04-21 DIAGNOSIS — R69 Illness, unspecified: Secondary | ICD-10-CM | POA: Diagnosis not present

## 2020-04-21 DIAGNOSIS — E039 Hypothyroidism, unspecified: Secondary | ICD-10-CM | POA: Diagnosis not present

## 2020-04-22 ENCOUNTER — Telehealth: Payer: Self-pay | Admitting: *Deleted

## 2020-04-22 ENCOUNTER — Telehealth (INDEPENDENT_AMBULATORY_CARE_PROVIDER_SITE_OTHER): Payer: Medicare HMO | Admitting: Gastroenterology

## 2020-04-22 ENCOUNTER — Other Ambulatory Visit: Payer: Self-pay

## 2020-04-22 DIAGNOSIS — R1310 Dysphagia, unspecified: Secondary | ICD-10-CM | POA: Diagnosis not present

## 2020-04-22 DIAGNOSIS — K219 Gastro-esophageal reflux disease without esophagitis: Secondary | ICD-10-CM | POA: Diagnosis not present

## 2020-04-22 DIAGNOSIS — Z86010 Personal history of colonic polyps: Secondary | ICD-10-CM

## 2020-04-22 MED ORDER — CLENPIQ 10-3.5-12 MG-GM -GM/160ML PO SOLN: 1.0000 | Freq: Once | ORAL | 0 refills | Status: AC

## 2020-04-22 NOTE — Progress Notes (Signed)
Cc'ed to pcp °

## 2020-04-22 NOTE — Telephone Encounter (Signed)
Called pt to schedule TCS/EGD/DIL with Dr. Gala Romney. Next available conscious sedation is in April. Patient needs before end of March. Please advise Vicente Males if patient can be done with propofol and if so, which ASA type? Dr. Gala Romney for ASA 3, propofol, double, would be end of April also just an [place]. Thanks!

## 2020-04-22 NOTE — Telephone Encounter (Signed)
Can be done with Propofol. ASA remains the same at 2.

## 2020-04-22 NOTE — Progress Notes (Signed)
Primary Care Physician:  Glenda Chroman, MD  Primary GI: Dr. Gala Romney   Patient Location: Home   Provider Location: [facility] office   Reason for Visit: Follow-up    Persons present on the virtual encounter, with roles: Patient and NP   Total time (minutes) spent on medical discussion: 12 minutes   Due to COVID-19, visit was conducted using virtual method.  Visit was requested by patient.  Virtual Visit via Telephone Note Due to COVID-19, visit is conducted virtually and was requested by patient.   I connected with [name] on 04/22/20 at  2:00 PM EST and verified that I am speaking with the correct person using two identifiers.   I discussed the limitations, risks, security and privacy concerns of performing an evaluation and management service by telephone and the availability of in person appointments. I also discussed with the patient that there may be a patient responsible charge related to this service. The patient expressed understanding and agreed to proceed.  Chief Complaint  Patient presents with  . Gastroesophageal Reflux    Doing better since adding Aciphex  . Diarrhea     History of Present Illness: 75 year old pleasant female with history of chronic constipation alternating with diarrhea and GERD, presenting virtually via telephone for follow-up. Due for colonoscopy in 2022.   Had episode of chest pain in October that was felt to be GERD related. Saw cardiology. Taking Protonix in the morning and Aciphex in the evening. Sometimes some pill dysphagia. Has had one or two episodes of nocturnal reflux and took an antacid. Wants and EGD at time of colonoscopy. No further dyspepsia after adding Aciphex. Still actually taking Protonix, which had "stopped working". Last EGD 2014.   Intermittent looser stool.Still taking Trulance daily. If doesn't take it, will get constipated.  Not taking Bentyl unless has to. Husband passed away in 2019-09-08. Feels anxiety is worse  since his passing. Very lonely at times. Moving in with her daughter end of March 2022. Daughter lives in [place]. Will be likely seeking GI care there.    Past Medical History:  Diagnosis Date  . Arthritis   . Depression   . Diverticulitis   . GERD (gastroesophageal reflux disease)   . Heart murmur    MILD, NO CARDIOLOGIST  . Hyperlipidemia   . Hypothyroidism   . IBS (irritable bowel syndrome)   . Primary localized osteoarthritis of right hip 11/11/2014  . Umbilical hernia      Past Surgical History:  Procedure Laterality Date  . ABDOMINAL HYSTERECTOMY  2003   OVARIES REMOVED  . CATARACT EXTRACTION W/PHACO Left 05/03/2012   Procedure: CATARACT EXTRACTION PHACO AND INTRAOCULAR LENS PLACEMENT (IOC);  Surgeon: Tonny Branch, MD;  Location: [facility];  Service: Ophthalmology;  Laterality: Left;  CDE: 16.80  . CATARACT EXTRACTION W/PHACO Right 05/21/2012   Procedure: CATARACT EXTRACTION PHACO AND INTRAOCULAR LENS PLACEMENT (IOC);  Surgeon: Tonny Branch, MD;  Location: [facility];  Service: Ophthalmology;  Laterality: Right;  CDE=12.31  . [place]  . CHOLECYSTECTOMY  01/10/2011   Procedure: LAPAROSCOPIC CHOLECYSTECTOMY;  Surgeon: Scherry Ran;  Location: [facility];  Service: General;  Laterality: N/A;  . COLONOSCOPY  2011   Dr. Arnoldo Morale: sigmoid diverticulosis, repeat Aug 2016 due to history of adenomatous polyps  . COLONOSCOPY N/A 02/04/2013   Dr. Gala Romney :Colonic diverticulosis, repeat in 2019  . COLONOSCOPY N/A 04/09/2015   Dr. Gala Romney: tubular adenoma and diverticulosis. Surveillance in 5 years   .  ESOPHAGOGASTRODUODENOSCOPY  2011   Dr. Arnoldo Morale: multiple gastric polyps, path with mild chronic gastritis  . ESOPHAGOGASTRODUODENOSCOPY N/A 02/04/2013   Dr. Rourk:Small hiatal hernia. Gastric polyps s/p bx, negative H.pylori. Benign path.   . EUS N/A 08/29/2013   Dr. Ardis Hughs: no clear cause for acute pancreatitis. Autoimmune labs normal. Question HCTZ as culprit  . TOTAL HIP ARTHROPLASTY  Right 11/11/2014   Procedure: TOTAL RIGHT HIP ARTHROPLASTY;  Surgeon: Marchia Bond, MD;  Location: [facility];  Service: Orthopedics;  Laterality: Right;  . TUBAL LIGATION  1970's     Current Meds  Medication Sig  . acetaminophen (TYLENOL) 650 MG CR tablet Take 1,300 mg by mouth every 8 (eight) hours as needed for pain.  Marland Kitchen dicyclomine (BENTYL) 10 MG capsule TAKE ONE CAPSULE BY MOUTH AS NEEDED FOR SPASMS 30 MINUTES BEFORE GOING OUT TO EAT  . Diphenhydramine-APAP, sleep, (UNISOM PM PAIN PO) Take at bedtime by mouth.  . escitalopram (LEXAPRO) 10 MG tablet Take 1 tablet by mouth daily.  Marland Kitchen ibuprofen (ADVIL,MOTRIN) 200 MG tablet Take 400 mg by mouth every 6 (six) hours as needed.  Marland Kitchen levothyroxine (SYNTHROID) 88 MCG tablet Take 88 mcg by mouth daily.  Marland Kitchen loperamide (IMODIUM) 2 MG capsule Take 2 mg by mouth as needed for diarrhea or loose stools.  . Magnesium 250 MG TABS Take by mouth daily.  Marland Kitchen OVER THE COUNTER MEDICATION Vitafusion 2 by mouth once a day  . pantoprazole (PROTONIX) 40 MG tablet TAKE 1 TABLET BY MOUTH EVERY DAY  . Plecanatide (TRULANCE) 3 MG TABS Take 1 tablet by mouth daily.  . primidone (MYSOLINE) 50 MG tablet Take 50 mg by mouth daily.  . Probiotic Product (PROBIOTIC DAILY PO) Take by mouth daily.  . RABEprazole (ACIPHEX) 20 MG tablet Take 20 mg by mouth daily.  . simvastatin (ZOCOR) 10 MG tablet Take 10 mg by mouth at bedtime.     Family History  Problem Relation Age of Onset  . Anesthesia problems Neg Hx   . Hypotension Neg Hx   . Malignant hyperthermia Neg Hx   . Pseudochol deficiency Neg Hx   . Colon cancer Neg Hx   . Pancreatitis Neg Hx     Social History   Socioeconomic History  . Marital status: Married    Spouse name: Not on file  . Number of children: Not on file  . Years of education: Not on file  . Highest education level: Not on file  Occupational History  . Not on file  Tobacco Use  . Smoking status: Never Smoker  . Smokeless tobacco: Never Used   Vaping Use  . Vaping Use: Not on file  Substance and Sexual Activity  . Alcohol use: No  . Drug use: No  . Sexual activity: Yes    Birth control/protection: None  Other Topics Concern  . Not on file  Social History Narrative   She is retired from [employer].     Social Determinants of Health   Financial Resource Strain: Not on file  Food Insecurity: Not on file  Transportation Needs: Not on file  Physical Activity: Not on file  Stress: Not on file  Social Connections: Not on file       Review of Systems: Gen: Denies fever, chills, anorexia. Denies fatigue, weakness, weight loss.  CV: Denies chest pain, palpitations, syncope, peripheral edema, and claudication. Resp: Denies dyspnea at rest, cough, wheezing, coughing up blood, and pleurisy. GI: see HPI Derm: Denies rash, itching, dry skin Psych:  Denies depression, anxiety, memory loss, confusion. No homicidal or suicidal ideation.  Heme: Denies bruising, bleeding, and enlarged lymph nodes.  Observations/Objective: No distress. Unable to perform physical exam due to telephone encounter. No video available. Current weight: 195, Height: 5'4". BMI 33.5.   Assessment and Plan: Very pleasant 75 year old female with history of alternating constipation and diarrhea, GERD, and history of colon polyps, presenting for telephone visit today in routine follow-up.  Constipation and diarrhea: continues with Trulance daily. Intermittent diarrhea noted but at baseline for patient. Worsened anxiety s/p husband's passing last year. No alarm signs/symptoms.   GERD: flare noted a few months ago. Cardiac etiology ruled out. Actually added Aciphex to her Protonix regimen. Protonix long-term. I have asked her to discontinue Protonix and only take Aciphex. May need to adjust this in the future to take BID short-term or add Pepcid in evenings. Dietary modifications discussed.   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 Requesting  EGD/dilation at time of colonoscopy for surveillance due to history of polyps. No solid food dysphagia.   History of colon polyps: due for surveillance now.    1. Stop Protonix. Continue only Aciphex 2. Continue Trulance, hold if diarrhea 3. Proceed with colonoscopy/EGD/dilation by Dr. Gala Romney in near future: the risks, benefits, and alternatives have been discussed with the patient in detail. The patient states understanding and desires to proceed. 4. Will need to establish care in [place] once she moves in with daughter late March 2022.    Follow Up Instructions:    I discussed the assessment and treatment plan with the patient. The patient was provided an opportunity to ask questions and all were answered. The patient agreed with the plan and demonstrated an understanding of the instructions.   The patient was advised to call back or seek an in-person evaluation if the symptoms worsen or if the condition fails to improve as anticipated.  I provided *12 minutes of face-to-face time during this MyChart Video encounter.  Annitta Needs, PhD, ANP-BC [facility]

## 2020-04-22 NOTE — Patient Instructions (Signed)
We are arranging a colonoscopy, upper endoscopy, and dilation by Dr. Gala Romney in the near future.  I recommend stopping Protonix. Only take Aciphex daily. Please call if this is not helpful! Make sure to take it 30 minutes before eating, on an empty stomach, for best absorption.   Do not eat within 2-3 hours of laying down to avoid nighttime reflux.   Best wishes as you move!   I enjoyed talking with you again today! As you know, I value our relationship and want to provide genuine, compassionate, and quality care. I welcome your feedback. If you receive a survey regarding your visit,  I greatly appreciate you taking time to fill this out. See you next time!  Annitta Needs, PhD, ANP-BC [facility]

## 2020-04-22 NOTE — H&P (View-Only) (Signed)
Primary Care Physician:  Glenda Chroman, MD  Primary GI: Dr. Gala Romney   Patient Location: Home   Provider Location: [facility] office   Reason for Visit: Follow-up    Persons present on the virtual encounter, with roles: Patient and NP   Total time (minutes) spent on medical discussion: 12 minutes   Due to COVID-19, visit was conducted using virtual method.  Visit was requested by patient.  Virtual Visit via Telephone Note Due to COVID-19, visit is conducted virtually and was requested by patient.   I connected with [name] on 04/22/20 at  2:00 PM EST and verified that I am speaking with the correct person using two identifiers.   I discussed the limitations, risks, security and privacy concerns of performing an evaluation and management service by telephone and the availability of in person appointments. I also discussed with the patient that there may be a patient responsible charge related to this service. The patient expressed understanding and agreed to proceed.  Chief Complaint  Patient presents with  . Gastroesophageal Reflux    Doing better since adding Aciphex  . Diarrhea     History of Present Illness: 75 year old pleasant female with history of chronic constipation alternating with diarrhea and GERD, presenting virtually via telephone for follow-up. Due for colonoscopy in 2022.   Had episode of chest pain in October that was felt to be GERD related. Saw cardiology. Taking Protonix in the morning and Aciphex in the evening. Sometimes some pill dysphagia. Has had one or two episodes of nocturnal reflux and took an antacid. Wants and EGD at time of colonoscopy. No further dyspepsia after adding Aciphex. Still actually taking Protonix, which had "stopped working". Last EGD 2014.   Intermittent looser stool.Still taking Trulance daily. If doesn't take it, will get constipated.  Not taking Bentyl unless has to. Husband passed away in 2019/08/21. Feels anxiety is worse  since his passing. Very lonely at times. Moving in with her daughter end of March 2022. Daughter lives in [place]. Will be likely seeking GI care there.    Past Medical History:  Diagnosis Date  . Arthritis   . Depression   . Diverticulitis   . GERD (gastroesophageal reflux disease)   . Heart murmur    MILD, NO CARDIOLOGIST  . Hyperlipidemia   . Hypothyroidism   . IBS (irritable bowel syndrome)   . Primary localized osteoarthritis of right hip 11/11/2014  . Umbilical hernia      Past Surgical History:  Procedure Laterality Date  . ABDOMINAL HYSTERECTOMY  2003   OVARIES REMOVED  . CATARACT EXTRACTION W/PHACO Left 05/03/2012   Procedure: CATARACT EXTRACTION PHACO AND INTRAOCULAR LENS PLACEMENT (IOC);  Surgeon: Tonny Branch, MD;  Location: [facility];  Service: Ophthalmology;  Laterality: Left;  CDE: 16.80  . CATARACT EXTRACTION W/PHACO Right 05/21/2012   Procedure: CATARACT EXTRACTION PHACO AND INTRAOCULAR LENS PLACEMENT (IOC);  Surgeon: Tonny Branch, MD;  Location: [facility];  Service: Ophthalmology;  Laterality: Right;  CDE=12.31  . Helen  . CHOLECYSTECTOMY  01/10/2011   Procedure: LAPAROSCOPIC CHOLECYSTECTOMY;  Surgeon: Scherry Ran;  Location: [facility];  Service: General;  Laterality: N/A;  . COLONOSCOPY  2011   Dr. Arnoldo Morale: sigmoid diverticulosis, repeat Aug 2016 due to history of adenomatous polyps  . COLONOSCOPY N/A 02/04/2013   Dr. Gala Romney :Colonic diverticulosis, repeat in 2019  . COLONOSCOPY N/A 04/09/2015   Dr. Gala Romney: tubular adenoma and diverticulosis. Surveillance in 5 years   .  ESOPHAGOGASTRODUODENOSCOPY  2011   Dr. Arnoldo Morale: multiple gastric polyps, path with mild chronic gastritis  . ESOPHAGOGASTRODUODENOSCOPY N/A 02/04/2013   Dr. Rourk:Small hiatal hernia. Gastric polyps s/p bx, negative H.pylori. Benign path.   . EUS N/A 08/29/2013   Dr. Ardis Hughs: no clear cause for acute pancreatitis. Autoimmune labs normal. Question HCTZ as culprit  . TOTAL HIP ARTHROPLASTY  Right 11/11/2014   Procedure: TOTAL RIGHT HIP ARTHROPLASTY;  Surgeon: Marchia Bond, MD;  Location: [place];  Service: Orthopedics;  Laterality: Right;  . TUBAL LIGATION  1970's     Current Meds  Medication Sig  . acetaminophen (TYLENOL) 650 MG CR tablet Take 1,300 mg by mouth every 8 (eight) hours as needed for pain.  Marland Kitchen dicyclomine (BENTYL) 10 MG capsule TAKE ONE CAPSULE BY MOUTH AS NEEDED FOR SPASMS 30 MINUTES BEFORE GOING OUT TO EAT  . Diphenhydramine-APAP, sleep, (UNISOM PM PAIN PO) Take at bedtime by mouth.  . escitalopram (LEXAPRO) 10 MG tablet Take 1 tablet by mouth daily.  Marland Kitchen ibuprofen (ADVIL,MOTRIN) 200 MG tablet Take 400 mg by mouth every 6 (six) hours as needed.  Marland Kitchen levothyroxine (SYNTHROID) 88 MCG tablet Take 88 mcg by mouth daily.  Marland Kitchen loperamide (IMODIUM) 2 MG capsule Take 2 mg by mouth as needed for diarrhea or loose stools.  . Magnesium 250 MG TABS Take by mouth daily.  Marland Kitchen OVER THE COUNTER MEDICATION Vitafusion 2 by mouth once a day  . pantoprazole (PROTONIX) 40 MG tablet TAKE 1 TABLET BY MOUTH EVERY DAY  . Plecanatide (TRULANCE) 3 MG TABS Take 1 tablet by mouth daily.  . primidone (MYSOLINE) 50 MG tablet Take 50 mg by mouth daily.  . Probiotic Product (PROBIOTIC DAILY PO) Take by mouth daily.  . RABEprazole (ACIPHEX) 20 MG tablet Take 20 mg by mouth daily.  . simvastatin (ZOCOR) 10 MG tablet Take 10 mg by mouth at bedtime.     Family History  Problem Relation Age of Onset  . Anesthesia problems Neg Hx   . Hypotension Neg Hx   . Malignant hyperthermia Neg Hx   . Pseudochol deficiency Neg Hx   . Colon cancer Neg Hx   . Pancreatitis Neg Hx     Social History   Socioeconomic History  . Marital status: Married    Spouse name: Not on file  . Number of children: Not on file  . Years of education: Not on file  . Highest education level: Not on file  Occupational History  . Not on file  Tobacco Use  . Smoking status: Never Smoker  . Smokeless tobacco: Never Used   Vaping Use  . Vaping Use: Not on file  Substance and Sexual Activity  . Alcohol use: No  . Drug use: No  . Sexual activity: Yes    Birth control/protection: None  Other Topics Concern  . Not on file  Social History Narrative   She is retired from [employer].     Social Determinants of Health   Financial Resource Strain: Not on file  Food Insecurity: Not on file  Transportation Needs: Not on file  Physical Activity: Not on file  Stress: Not on file  Social Connections: Not on file       Review of Systems: Gen: Denies fever, chills, anorexia. Denies fatigue, weakness, weight loss.  CV: Denies chest pain, palpitations, syncope, peripheral edema, and claudication. Resp: Denies dyspnea at rest, cough, wheezing, coughing up blood, and pleurisy. GI: see HPI Derm: Denies rash, itching, dry skin Psych:  Denies depression, anxiety, memory loss, confusion. No homicidal or suicidal ideation.  Heme: Denies bruising, bleeding, and enlarged lymph nodes.  Observations/Objective: No distress. Unable to perform physical exam due to telephone encounter. No video available. Current weight: 195, Height: 5'4". BMI 33.5.   Assessment and Plan: Very pleasant 75 year old female with history of alternating constipation and diarrhea, GERD, and history of colon polyps, presenting for telephone visit today in routine follow-up.  Constipation and diarrhea: continues with Trulance daily. Intermittent diarrhea noted but at baseline for patient. Worsened anxiety s/p husband's passing last year. No alarm signs/symptoms.   GERD: flare noted a few months ago. Cardiac etiology ruled out. Actually added Aciphex to her Protonix regimen. Protonix long-term. I have asked her to discontinue Protonix and only take Aciphex. May need to adjust this in the future to take BID short-term or add Pepcid in evenings. Dietary modifications discussed.   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 Requesting  EGD/dilation at time of colonoscopy for surveillance due to history of polyps. No solid food dysphagia.   History of colon polyps: due for surveillance now.    1. Stop Protonix. Continue only Aciphex 2. Continue Trulance, hold if diarrhea 3. Proceed with colonoscopy/EGD/dilation by Dr. Gala Romney in near future: the risks, benefits, and alternatives have been discussed with the patient in detail. The patient states understanding and desires to proceed. 4. Will need to establish care in [place] once she moves in with daughter late March 2022.    Follow Up Instructions:    I discussed the assessment and treatment plan with the patient. The patient was provided an opportunity to ask questions and all were answered. The patient agreed with the plan and demonstrated an understanding of the instructions.   The patient was advised to call back or seek an in-person evaluation if the symptoms worsen or if the condition fails to improve as anticipated.  I provided *12 minutes of face-to-face time during this MyChart Video encounter.  Annitta Needs, PhD, ANP-BC [facility]

## 2020-04-22 NOTE — Telephone Encounter (Signed)
Called pt. She has been scheduled for 3/17 am appt. Aware will mail prep instructions with her covid test appt. She wants a low volume prep called in. Advised will send clenpiq but will need to check with pharmacy on price. She voiced understanding.

## 2020-05-12 ENCOUNTER — Other Ambulatory Visit: Payer: Self-pay

## 2020-05-12 ENCOUNTER — Other Ambulatory Visit (HOSPITAL_COMMUNITY)
Admission: RE | Admit: 2020-05-12 | Discharge: 2020-05-12 | Disposition: A | Discharge status: Home / Self Care | Payer: Medicare HMO | Source: Ambulatory Visit | Attending: Internal Medicine | Admitting: Internal Medicine

## 2020-05-12 DIAGNOSIS — Z01812 Encounter for preprocedural laboratory examination: Secondary | ICD-10-CM | POA: Insufficient documentation

## 2020-05-12 DIAGNOSIS — Z20822 Contact with and (suspected) exposure to covid-19: Secondary | ICD-10-CM | POA: Diagnosis not present

## 2020-05-13 ENCOUNTER — Telehealth: Payer: Self-pay

## 2020-05-13 LAB — SARS CORONAVIRUS 2 (TAT 6-24 HRS): SARS Coronavirus 2: NEGATIVE

## 2020-05-13 NOTE — Telephone Encounter (Signed)
PA for Trulance was approved through pts insurance. Approval letter will be scanned when received.

## 2020-05-13 NOTE — Telephone Encounter (Signed)
PA completed for Trulance on [email or web address]. waiting on an approval or denial.

## 2020-05-14 ENCOUNTER — Ambulatory Visit (HOSPITAL_COMMUNITY)
Admission: RE | Admit: 2020-05-14 | Discharge: 2020-05-14 | Disposition: A | Discharge status: Home / Self Care | Payer: Medicare HMO | Attending: Internal Medicine | Admitting: Internal Medicine

## 2020-05-14 ENCOUNTER — Other Ambulatory Visit: Payer: Self-pay

## 2020-05-14 ENCOUNTER — Ambulatory Visit (HOSPITAL_COMMUNITY): Payer: Medicare HMO | Admitting: Anesthesiology

## 2020-05-14 ENCOUNTER — Encounter (HOSPITAL_COMMUNITY)
Admission: RE | Disposition: A | Discharge status: Home / Self Care | Payer: Self-pay | Source: Home / Self Care | Attending: Internal Medicine

## 2020-05-14 ENCOUNTER — Encounter (HOSPITAL_COMMUNITY): Payer: Self-pay | Admitting: Internal Medicine

## 2020-05-14 DIAGNOSIS — Z96641 Presence of right artificial hip joint: Secondary | ICD-10-CM | POA: Insufficient documentation

## 2020-05-14 DIAGNOSIS — Z9049 Acquired absence of other specified parts of digestive tract: Secondary | ICD-10-CM | POA: Diagnosis not present

## 2020-05-14 DIAGNOSIS — E039 Hypothyroidism, unspecified: Secondary | ICD-10-CM | POA: Diagnosis not present

## 2020-05-14 DIAGNOSIS — K449 Diaphragmatic hernia without obstruction or gangrene: Secondary | ICD-10-CM | POA: Diagnosis not present

## 2020-05-14 DIAGNOSIS — K317 Polyp of stomach and duodenum: Secondary | ICD-10-CM

## 2020-05-14 DIAGNOSIS — E785 Hyperlipidemia, unspecified: Secondary | ICD-10-CM | POA: Insufficient documentation

## 2020-05-14 DIAGNOSIS — R1310 Dysphagia, unspecified: Secondary | ICD-10-CM | POA: Diagnosis not present

## 2020-05-14 DIAGNOSIS — Z86010 Personal history of colonic polyps: Secondary | ICD-10-CM

## 2020-05-14 DIAGNOSIS — K5730 Diverticulosis of large intestine without perforation or abscess without bleeding: Secondary | ICD-10-CM | POA: Insufficient documentation

## 2020-05-14 DIAGNOSIS — K219 Gastro-esophageal reflux disease without esophagitis: Secondary | ICD-10-CM | POA: Insufficient documentation

## 2020-05-14 DIAGNOSIS — Z1211 Encounter for screening for malignant neoplasm of colon: Secondary | ICD-10-CM | POA: Insufficient documentation

## 2020-05-14 DIAGNOSIS — Z79890 Hormone replacement therapy (postmenopausal): Secondary | ICD-10-CM | POA: Insufficient documentation

## 2020-05-14 DIAGNOSIS — Z90710 Acquired absence of both cervix and uterus: Secondary | ICD-10-CM | POA: Insufficient documentation

## 2020-05-14 DIAGNOSIS — Z79899 Other long term (current) drug therapy: Secondary | ICD-10-CM | POA: Insufficient documentation

## 2020-05-14 HISTORY — PX: MALONEY DILATION: SHX5535

## 2020-05-14 HISTORY — PX: ESOPHAGOGASTRODUODENOSCOPY (EGD) WITH PROPOFOL: SHX5813

## 2020-05-14 HISTORY — PX: BIOPSY: SHX5522

## 2020-05-14 HISTORY — PX: COLONOSCOPY WITH PROPOFOL: SHX5780

## 2020-05-14 SURGERY — COLONOSCOPY WITH PROPOFOL
Anesthesia: General

## 2020-05-14 MED ORDER — STERILE WATER FOR IRRIGATION IR SOLN
Status: DC | PRN
  Administered 2020-05-14: 200 mL

## 2020-05-14 MED ORDER — PROPOFOL 500 MG/50ML IV EMUL
INTRAVENOUS | Status: DC | PRN
  Administered 2020-05-14: 150 ug/kg/min via INTRAVENOUS

## 2020-05-14 MED ORDER — LIDOCAINE HCL (CARDIAC) PF 100 MG/5ML IV SOSY
PREFILLED_SYRINGE | INTRAVENOUS | Status: DC | PRN
  Administered 2020-05-14: 50 mg via INTRAVENOUS

## 2020-05-14 MED ORDER — LACTATED RINGERS IV SOLN: INTRAVENOUS | Status: DC

## 2020-05-14 MED ORDER — PHENYLEPHRINE 40 MCG/ML (10ML) SYRINGE FOR IV PUSH (FOR BLOOD PRESSURE SUPPORT)
PREFILLED_SYRINGE | INTRAVENOUS | Status: DC | PRN
  Administered 2020-05-14: 40 ug via INTRAVENOUS
  Administered 2020-05-14: 80 ug via INTRAVENOUS

## 2020-05-14 MED ORDER — PHENYLEPHRINE 40 MCG/ML (10ML) SYRINGE FOR IV PUSH (FOR BLOOD PRESSURE SUPPORT)
PREFILLED_SYRINGE | INTRAVENOUS | Status: AC
  Filled 2020-05-14: qty 10

## 2020-05-14 MED ORDER — PROPOFOL 10 MG/ML IV BOLUS
INTRAVENOUS | Status: DC | PRN
  Administered 2020-05-14: 100 mg via INTRAVENOUS

## 2020-05-14 NOTE — Interval H&P Note (Signed)
History and Physical Interval Note:  05/14/2020 10:15 AM  [name]  has presented today for surgery, with the diagnosis of dysphagia, hx polyps.  The various methods of treatment have been discussed with the patient and family. After consideration of risks, benefits and other options for treatment, the patient has consented to  Procedure(s) with comments: COLONOSCOPY WITH PROPOFOL (N/A) - am appt ESOPHAGOGASTRODUODENOSCOPY (EGD) WITH PROPOFOL (N/A) MALONEY DILATION (N/A) as a surgical intervention.  The patient's history has been reviewed, patient examined, no change in status, stable for surgery.  I have reviewed the patient's chart and labs.  Questions were answered to the patient's satisfaction.     Manus Rudd  Patient seen and examined no change.  Aside from aside from reflux being better controlled now.  EGD with esophageal dilation followed by colonoscopy per plan.  The risks, benefits, limitations, imponderables and alternatives regarding both EGD and colonoscopy have been reviewed with the patient. Questions have been answered. All parties agreeable.

## 2020-05-14 NOTE — Discharge Instructions (Signed)
Colonoscopy Discharge Instructions  Read the instructions outlined below and refer to this sheet in the next few weeks. These discharge instructions provide you with general information on caring for yourself after you leave the hospital. Your doctor may also give you specific instructions. While your treatment has been planned according to the most current medical practices available, unavoidable complications occasionally occur. If you have any problems or questions after discharge, call Dr. Gala Romney at [phone]. ACTIVITY  You may resume your regular activity, but move at a slower pace for the next 24 hours.   Take frequent rest periods for the next 24 hours.   Walking will help get rid of the air and reduce the bloated feeling in your belly (abdomen).   No driving for 24 hours (because of the medicine (anesthesia) used during the test).    Do not sign any important legal documents or operate any machinery for 24 hours (because of the anesthesia used during the test).  NUTRITION  Drink plenty of fluids.   You may resume your normal diet as instructed by your doctor.   Begin with a light meal and progress to your normal diet. Heavy or fried foods are harder to digest and may make you feel sick to your stomach (nauseated).   Avoid alcoholic beverages for 24 hours or as instructed.  MEDICATIONS  You may resume your normal medications unless your doctor tells you otherwise.  WHAT YOU CAN EXPECT TODAY  Some feelings of bloating in the abdomen.   Passage of more gas than usual.   Spotting of blood in your stool or on the toilet paper.  IF YOU HAD POLYPS REMOVED DURING THE COLONOSCOPY:  No aspirin products for 7 days or as instructed.   No alcohol for 7 days or as instructed.   Eat a soft diet for the next 24 hours.  FINDING OUT THE RESULTS OF YOUR TEST Not all test results are available during your visit. If your test results are not back during the visit, make an appointment  with your caregiver to find out the results. Do not assume everything is normal if you have not heard from your caregiver or the medical facility. It is important for you to follow up on all of your test results.  SEEK IMMEDIATE MEDICAL ATTENTION IF:  You have more than a spotting of blood in your stool.   Your belly is swollen (abdominal distention).   You are nauseated or vomiting.   You have a temperature over 101.   You have abdominal pain or discomfort that is severe or gets worse throughout the day.   EGD Discharge instructions Please read the instructions outlined below and refer to this sheet in the next few weeks. These discharge instructions provide you with general information on caring for yourself after you leave the hospital. Your doctor may also give you specific instructions. While your treatment has been planned according to the most current medical practices available, unavoidable complications occasionally occur. If you have any problems or questions after discharge, please call your doctor. ACTIVITY  You may resume your regular activity but move at a slower pace for the next 24 hours.   Take frequent rest periods for the next 24 hours.   Walking will help expel (get rid of) the air and reduce the bloated feeling in your abdomen.   No driving for 24 hours (because of the anesthesia (medicine) used during the test).   You may shower.   Do not sign any  important legal documents or operate any machinery for 24 hours (because of the anesthesia used during the test).  NUTRITION  Drink plenty of fluids.   You may resume your normal diet.   Begin with a light meal and progress to your normal diet.   Avoid alcoholic beverages for 24 hours or as instructed by your caregiver.  MEDICATIONS  You may resume your normal medications unless your caregiver tells you otherwise.  WHAT YOU CAN EXPECT TODAY  You may experience abdominal discomfort such as a feeling of  fullness or "gas" pains.  FOLLOW-UP  Your doctor will discuss the results of your test with you.  SEEK IMMEDIATE MEDICAL ATTENTION IF ANY OF THE FOLLOWING OCCUR:  Excessive nausea (feeling sick to your stomach) and/or vomiting.   Severe abdominal pain and distention (swelling).   Trouble swallowing.   Temperature over 101 F (37.8 C).   Rectal bleeding or vomiting of blood.    No polyps found today in your colon.  You do have diverticulosis.  I do not recommend a future colonoscopy unless new symptoms develop.  Your esophagus was stretched today.  I removed a polyp from your stomach.  Further recommendations to follow pending review of pathology report  Office visit with [place] in 3 months  Can you AcipHex 20 mg daily  At patient request, I called Meredith Leeds at [phone] -discussed findings and recommendations  PATIENT INSTRUCTIONS POST-ANESTHESIA  IMMEDIATELY FOLLOWING SURGERY:  Do not drive or operate machinery for the first twenty four hours after surgery.  Do not make any important decisions for twenty four hours after surgery or while taking narcotic pain medications or sedatives.  If you develop intractable nausea and vomiting or a severe headache please notify your doctor immediately.  FOLLOW-UP:  Please make an appointment with your surgeon as instructed. You do not need to follow up with anesthesia unless specifically instructed to do so.  WOUND CARE INSTRUCTIONS (if applicable):  Keep a dry clean dressing on the anesthesia/puncture wound site if there is drainage.  Once the wound has quit draining you may leave it open to air.  Generally you should leave the bandage intact for twenty four hours unless there is drainage.  If the epidural site drains for more than 36-48 hours please call the anesthesia department.  QUESTIONS?:  Please feel free to call your physician or the hospital operator if you have any questions, and they will be happy to assist you.

## 2020-05-14 NOTE — Anesthesia Postprocedure Evaluation (Signed)
Anesthesia Post Note  Patient: SELA FALK  Procedure(s) Performed: COLONOSCOPY WITH PROPOFOL (N/A ) ESOPHAGOGASTRODUODENOSCOPY (EGD) WITH PROPOFOL (N/A ) MALONEY DILATION (N/A ) BIOPSY  Patient location during evaluation: Endoscopy Anesthesia Type: General Level of consciousness: awake and alert and oriented Pain management: pain level controlled Vital Signs Assessment: post-procedure vital signs reviewed and stable Respiratory status: spontaneous breathing, nonlabored ventilation and respiratory function stable Cardiovascular status: blood pressure returned to baseline and stable Postop Assessment: no apparent nausea or vomiting Anesthetic complications: no   No complications documented.   Last Vitals:  Vitals:   05/14/20 0943 05/14/20 1106  BP: (!) 147/79 112/61  Pulse: (!) 105 79  Resp: (!) 25 19  Temp: 36.6 C 36.6 C  SpO2: 94% 96%    Last Pain:  Vitals:   05/14/20 1106  TempSrc: Oral  PainSc: 0-No pain                 Orlie Dakin

## 2020-05-14 NOTE — Anesthesia Procedure Notes (Signed)
Date/Time: 05/14/2020 10:37 AM Performed by: Orlie Dakin, CRNA Pre-anesthesia Checklist: Patient identified, Emergency Drugs available, Suction available and Patient being monitored Patient Re-evaluated:Patient Re-evaluated prior to induction Oxygen Delivery Method: Nasal cannula Induction Type: IV induction Placement Confirmation: positive ETCO2

## 2020-05-14 NOTE — Addendum Note (Signed)
Addendum  created 05/14/20 1335 by Orlie Dakin, CRNA   Intraprocedure Event edited

## 2020-05-14 NOTE — Op Note (Signed)
[facility] Patient Name: [name] Procedure Date: 05/14/2020 10:38 AM MRN: [id number] Date of Birth: [birth year] Attending MD: Norvel Richards , MD CSN: [id number] Age: 75 Admit Type: Outpatient Procedure:                Colonoscopy Indications:              High risk colon cancer surveillance: Personal                            history of colonic polyps Providers:                Norvel Richards, MD, Lambert Mody,                            Raphael Gibney, Technician Referring MD:              Medicines:                Propofol per Anesthesia Complications:            No immediate complications. Estimated Blood Loss:     Estimated blood loss: none. Procedure:                Pre-Anesthesia Assessment:                           - Prior to the procedure, a History and Physical                            was performed, and patient medications and                            allergies were reviewed. The patient's tolerance of                            previous anesthesia was also reviewed. The risks                            and benefits of the procedure and the sedation                            options and risks were discussed with the patient.                            All questions were answered, and informed consent                            was obtained. Prior Anticoagulants: The patient has                            taken no previous anticoagulant or antiplatelet                            agents. ASA Grade Assessment: II - A patient with  mild systemic disease. After reviewing the risks                            and benefits, the patient was deemed in                            satisfactory condition to undergo the procedure.                           After obtaining informed consent, the colonoscope                            was passed under direct vision. Throughout the                            procedure, the  patient's blood pressure, pulse, and                            oxygen saturations were monitored continuously. The                            CF-[id number] ([id number]) scope was introduced through                            the anus and advanced to the the cecum, identified                            by appendiceal orifice and ileocecal valve. The                            colonoscopy was performed without difficulty. The                            patient tolerated the procedure well. The quality                            of the bowel preparation was adequate. Scope In: 10:42:38 AM Scope Out: 11:01:19 AM Scope Withdrawal Time: 0 hours 10 minutes 23 seconds  Total Procedure Duration: 0 hours 18 minutes 41 seconds  Findings:      The perianal and digital rectal examinations were normal.      Scattered medium-mouthed diverticula were found in the sigmoid colon and       descending colon.      The exam was otherwise without abnormality on direct and retroflexion       views. Impression:               - Diverticulosis in the sigmoid colon and in the                            descending colon.                           - The examination was otherwise normal on direct  and retroflexion views.                           - No specimens collected. Moderate Sedation:      Moderate (conscious) sedation was personally administered by an       anesthesia professional. The following parameters were monitored: oxygen       saturation, heart rate, blood pressure, respiratory rate, EKG, adequacy       of pulmonary ventilation, and response to care. Recommendation:           - Patient has a contact number available for                            emergencies. The signs and symptoms of potential                            delayed complications were discussed with the                            patient. Return to normal activities tomorrow.                            Written  discharge instructions were provided to the                            patient.                           - Resume previous diet.                           - Patient has a contact number available for                            emergencies. The signs and symptoms of potential                            delayed complications were discussed with the                            patient. Return to normal activities tomorrow.                            Written discharge instructions were provided to the                            patient.                           - Resume previous diet.                           - Continue present medications.                           - No repeat colonoscopy due to the absence of  advanced adenomas.                           - Return to GI clinic in 3 months. See EGD report Procedure Code(s):        --- Professional ---                           [phone], Colonoscopy, flexible; diagnostic, including                            collection of specimen(s) by brushing or washing,                            when performed (separate procedure) Diagnosis Code(s):        --- Professional ---                           Z86.010, Personal history of colonic polyps                           K57.30, Diverticulosis of large intestine without                            perforation or abscess without bleeding CPT copyright 2019 [employer]. All rights reserved. The codes documented in this report are preliminary and upon coder review may  be revised to meet current compliance requirements. Cristopher Estimable. Anneth Brunell, MD Norvel Richards, MD 05/14/2020 11:05:12 AM This report has been signed electronically. Number of Addenda: 0

## 2020-05-14 NOTE — Anesthesia Preprocedure Evaluation (Addendum)
Anesthesia Evaluation  Patient identified by MRN, date of birth, ID band Patient awake    Reviewed: Allergy & Precautions, NPO status , Patient's Chart, lab work & pertinent test results  History of Anesthesia Complications Negative for: history of anesthetic complications  Airway Mallampati: II  TM Distance: >3 FB Neck ROM: Full    Dental  (+) Dental Advisory Given, Missing   Pulmonary neg pulmonary ROS,    Pulmonary exam normal breath sounds clear to auscultation       Cardiovascular Exercise Tolerance: Good Normal cardiovascular exam+ Valvular Problems/Murmurs  Rhythm:Regular Rate:Normal     Neuro/Psych PSYCHIATRIC DISORDERS Depression negative neurological ROS     GI/Hepatic Neg liver ROS, GERD  Medicated and Controlled,  Endo/Other  Hypothyroidism   Renal/GU negative Renal ROS     Musculoskeletal  (+) Arthritis , Osteoarthritis,    Abdominal   Peds  Hematology negative hematology ROS (+)   Anesthesia Other Findings   Reproductive/Obstetrics negative OB ROS                            Anesthesia Physical Anesthesia Plan  ASA: II  Anesthesia Plan: General   Post-op Pain Management:    Induction: Intravenous  PONV Risk Score and Plan: Propofol infusion and TIVA  Airway Management Planned: Nasal Cannula and Natural Airway  Additional Equipment:   Intra-op Plan:   Post-operative Plan:   Informed Consent: I have reviewed the patients History and Physical, chart, labs and discussed the procedure including the risks, benefits and alternatives for the proposed anesthesia with the patient or authorized representative who has indicated his/her understanding and acceptance.       Plan Discussed with: CRNA and Surgeon  Anesthesia Plan Comments:         Anesthesia Quick Evaluation

## 2020-05-14 NOTE — Op Note (Signed)
[facility] Patient Name: [name] Procedure Date: 05/14/2020 10:16 AM MRN: [id number] Date of Birth: 17-Oct-1945 Attending MD: Norvel Richards , MD CSN: [id number] Age: 75 Admit Type: Outpatient Procedure:                Upper GI endoscopy Indications:              Dysphagia Providers:                Norvel Richards, MD, Lambert Mody,                            Raphael Gibney, Technician Referring MD:              Medicines:                Propofol per Anesthesia Complications:            No immediate complications. Estimated Blood Loss:     Estimated blood loss was minimal. Procedure:                Pre-Anesthesia Assessment:                           - Prior to the procedure, a History and Physical                            was performed, and patient medications and                            allergies were reviewed. The patient's tolerance of                            previous anesthesia was also reviewed. The risks                            and benefits of the procedure and the sedation                            options and risks were discussed with the patient.                            All questions were answered, and informed consent                            was obtained. Prior Anticoagulants: The patient has                            taken no previous anticoagulant or antiplatelet                            agents. ASA Grade Assessment: II - A patient with                            mild systemic disease. After reviewing the risks  and benefits, the patient was deemed in                            satisfactory condition to undergo the procedure.                           After obtaining informed consent, the endoscope was                            passed under direct vision. Throughout the                            procedure, the patient's blood pressure, pulse, and                            oxygen saturations  were monitored continuously. The                            [id number]-[id number] ([id number]) scope was introduced through the                            mouth, and advanced to the second part of duodenum.                            The upper GI endoscopy was accomplished without                            difficulty. The patient tolerated the procedure                            well. Scope In: 10:27:01 AM Scope Out: 10:34:02 AM Total Procedure Duration: 0 hours 7 minutes 1 second  Findings:      The examined esophagus was normal.      A small hiatal hernia was present.      Multiple 5 to 7 mm fundal gland appearing polyps found in the fundus. I       did not see anything suspicious for infiltrating process. No ulcer.       Patent pylorus..      The duodenal bulb and second portion of the duodenum were normal. The       scope was withdrawn. Dilation was performed with a Maloney dilator with       mild resistance at 56 Fr. The dilation site was examined and showed no       change. Estimated blood loss: none. Finally, one of the gastric polyps       was removed with cold biopsy forceps. Impression:               - Normal esophagus. ` Status post dilation                           - Small hiatal hernia. Gastric polyps. status post                            biopsy                           -  Normal duodenal bulb and second portion of the                            duodenum. Moderate Sedation:      Moderate (conscious) sedation was personally administered by an       anesthesia professional. The following parameters were monitored: oxygen       saturation, heart rate, blood pressure, respiratory rate, EKG, adequacy       of pulmonary ventilation, and response to care. Recommendation:           - Patient has a contact number available for                            emergencies. The signs and symptoms of potential                            delayed complications were discussed with the                             patient. Return to normal activities tomorrow.                            Written discharge instructions were provided to the                            patient.                           - Advance diet as tolerated.                           - Continue present medications. Continue AcipHex 20                            mg daily as this appears to be helping her with                            reflux the most.                           - Return to my office in 3 months. See Colonoscopy                            report Procedure Code(s):        --- Professional ---                           [phone], Esophagogastroduodenoscopy, flexible,                            transoral; diagnostic, including collection of                            specimen(s) by brushing or washing, when performed                            (  separate procedure)                           43450, Dilation of esophagus, by unguided sound or                            bougie, single or multiple passes Diagnosis Code(s):        --- Professional ---                           K44.9, Diaphragmatic hernia without obstruction or                            gangrene                           K31.7, Polyp of stomach and duodenum                           R13.10, Dysphagia, unspecified CPT copyright 2019 [employer]. All rights reserved. The codes documented in this report are preliminary and upon coder review may  be revised to meet current compliance requirements. Cristopher Estimable. Jabre Heo, MD Norvel Richards, MD 05/14/2020 10:41:18 AM This report has been signed electronically. Number of Addenda: 0

## 2020-05-14 NOTE — Transfer of Care (Signed)
Immediate Anesthesia Transfer of Care Note  Patient: MENA SIMONIS  Procedure(s) Performed: COLONOSCOPY WITH PROPOFOL (N/A ) ESOPHAGOGASTRODUODENOSCOPY (EGD) WITH PROPOFOL (N/A ) MALONEY DILATION (N/A ) BIOPSY  Patient Location: Endoscopy Unit  Anesthesia Type:General  Level of Consciousness: awake and alert   Airway & Oxygen Therapy: Patient Spontanous Breathing  Post-op Assessment: Report given to RN, Post -op Vital signs reviewed and stable and Patient moving all extremities X 4  Post vital signs: Reviewed and stable  Last Vitals:  Vitals Value Taken Time  BP    Temp    Pulse    Resp    SpO2      Last Pain:  Vitals:   05/14/20 1021  TempSrc:   PainSc: 0-No pain      Patients Stated Pain Goal: 8 (64/68/03 2122)  Complications: No complications documented.

## 2020-05-15 ENCOUNTER — Encounter: Payer: Self-pay | Admitting: Internal Medicine

## 2020-05-15 LAB — SURGICAL PATHOLOGY

## 2020-05-17 ENCOUNTER — Other Ambulatory Visit: Payer: Self-pay | Admitting: Gastroenterology

## 2020-05-22 ENCOUNTER — Encounter (HOSPITAL_COMMUNITY): Payer: Self-pay | Admitting: Internal Medicine

## 2020-05-25 ENCOUNTER — Other Ambulatory Visit: Payer: Self-pay | Admitting: Gastroenterology

## 2020-05-25 ENCOUNTER — Telehealth: Payer: Self-pay

## 2020-05-25 DIAGNOSIS — K5900 Constipation, unspecified: Secondary | ICD-10-CM

## 2020-05-25 MED ORDER — TRULANCE 3 MG PO TABS
1.0000 | ORAL_TABLET | Freq: Every day | ORAL | 0 refills | Status: DC
Start: 2020-05-25 — End: 2020-08-03

## 2020-05-25 NOTE — Telephone Encounter (Signed)
The pt called and would like her Trulance 3 mg sent to [facility] in [place] . (one time occurrence).

## 2020-05-25 NOTE — Telephone Encounter (Signed)
Just one month, she is with her daughter in [place]

## 2020-05-25 NOTE — Telephone Encounter (Signed)
Does she just need a one month supply?

## 2020-05-25 NOTE — Telephone Encounter (Signed)
Rx sent 

## 2020-05-26 NOTE — Telephone Encounter (Signed)
noted 

## 2020-05-27 DIAGNOSIS — K580 Irritable bowel syndrome with diarrhea: Secondary | ICD-10-CM | POA: Diagnosis not present

## 2020-05-27 DIAGNOSIS — I10 Essential (primary) hypertension: Secondary | ICD-10-CM | POA: Diagnosis not present

## 2020-05-27 DIAGNOSIS — E039 Hypothyroidism, unspecified: Secondary | ICD-10-CM | POA: Diagnosis not present

## 2020-05-27 DIAGNOSIS — R079 Chest pain, unspecified: Secondary | ICD-10-CM | POA: Diagnosis not present

## 2020-08-01 ENCOUNTER — Other Ambulatory Visit: Payer: Self-pay | Admitting: Gastroenterology

## 2020-08-01 DIAGNOSIS — K5900 Constipation, unspecified: Secondary | ICD-10-CM

## 2020-08-24 ENCOUNTER — Ambulatory Visit: Payer: Medicare HMO | Admitting: Gastroenterology

## 2020-10-27 ENCOUNTER — Telehealth: Payer: Self-pay | Admitting: Internal Medicine

## 2020-10-27 NOTE — Telephone Encounter (Signed)
Pt transferred her records to [facility] in [place], [place]

## 2020-10-27 NOTE — Telephone Encounter (Signed)
Noted  

## 2020-12-03 ENCOUNTER — Other Ambulatory Visit: Payer: Self-pay | Admitting: Gastroenterology

## 2020-12-04 NOTE — Telephone Encounter (Signed)
Please clarify with patient. Per Anna's last note, she advised her to stop pantoprazole and continue only aciphex.

## 2020-12-07 ENCOUNTER — Telehealth: Payer: Self-pay | Admitting: Internal Medicine

## 2020-12-07 NOTE — Telephone Encounter (Signed)
Pt was called due to a refill request that was received from [facility] in [place]. Pt was called back and message was sent to [facility] notifying her of pt's transfer status.

## 2020-12-07 NOTE — Telephone Encounter (Signed)
Lmom for pt to return my call.  

## 2020-12-07 NOTE — Telephone Encounter (Signed)
Pt called earlier returning a call. I didn't see a phone note where anyone here had tried calling. She called back a second time and said that Tammy had called this morning. Patient said that she has moved to [place] and has another GI doctor there. I told her that the nurse was on another line and I would let her know that she had called. [phone]

## 2020-12-07 NOTE — Telephone Encounter (Signed)
Pt is no longer a pt of ours. Pt moved to [place] and has a GI provider there.

## 2020-12-09 NOTE — Telephone Encounter (Signed)
Informed pharmacy that the pt is no longer a pt at this practice.

## 2021-01-01 ENCOUNTER — Other Ambulatory Visit: Payer: Self-pay | Admitting: Gastroenterology

## 2021-03-02 ENCOUNTER — Telehealth: Payer: Self-pay

## 2021-03-02 NOTE — Telephone Encounter (Signed)
Phoned and LMOVM for the pt regarding [facility] sending a PA for Trulance and that since she has transferred care to another GI doctor they need to take care of this for her.

## 2021-08-04 ENCOUNTER — Other Ambulatory Visit: Payer: Self-pay | Admitting: Gastroenterology

## 2021-08-04 DIAGNOSIS — K5900 Constipation, unspecified: Secondary | ICD-10-CM

## 2022-03-20 IMAGING — US US EXTREM LOW VENOUS*L*
1 series · 13 of 24 positions shown · non-contrast
Comparison: None.

CLINICAL DATA: Left lower extremity pain and redness

EXAM:
LEFT LOWER EXTREMITY VENOUS DUPLEX ULTRASOUND
TECHNIQUE: Gray-scale sonography with graded compression, as well as color
Doppler and duplex ultrasound were performed to evaluate the left
lower extremity deep venous system from the level of the common
femoral vein and including the common femoral, femoral, profunda
femoral, popliteal and calf veins including the posterior tibial,
peroneal and gastrocnemius veins when visible. The superficial great
saphenous vein was also interrogated. Spectral Doppler was utilized
to evaluate flow at rest and with distal augmentation maneuvers in
the common femoral, femoral and popliteal veins.

[Series 1: us extrem low venous*left* · 0.08mm/px · 13 of 35 slices shown]
[im 1/35]
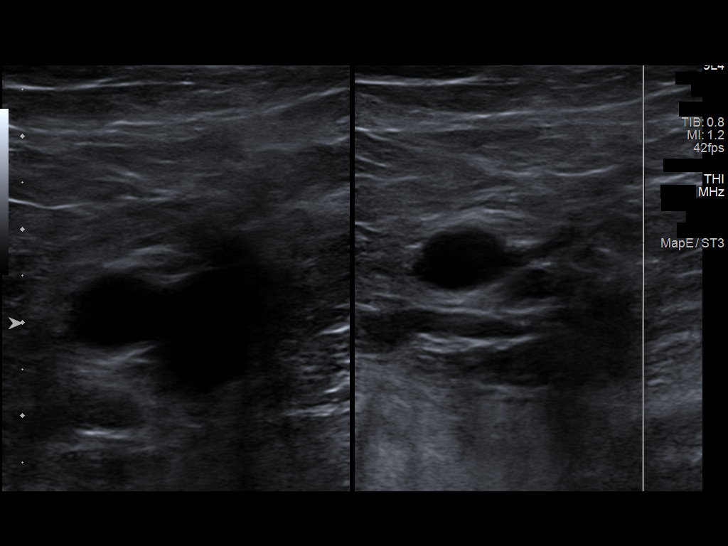
[im 3/35]
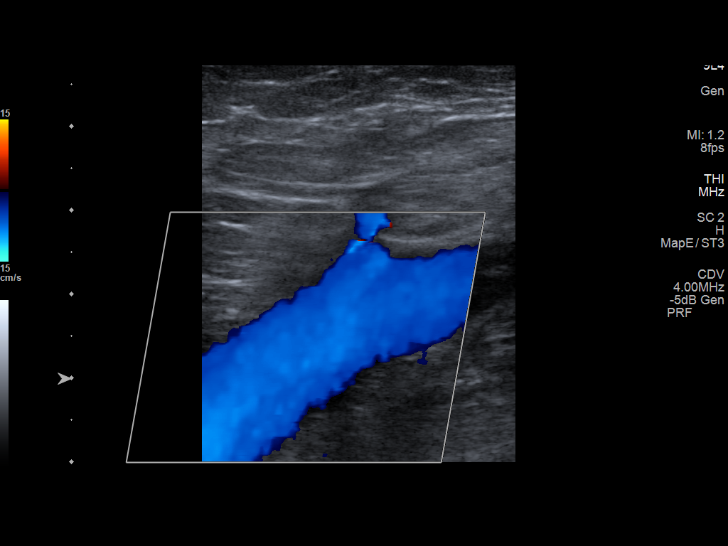
[im 6/35]
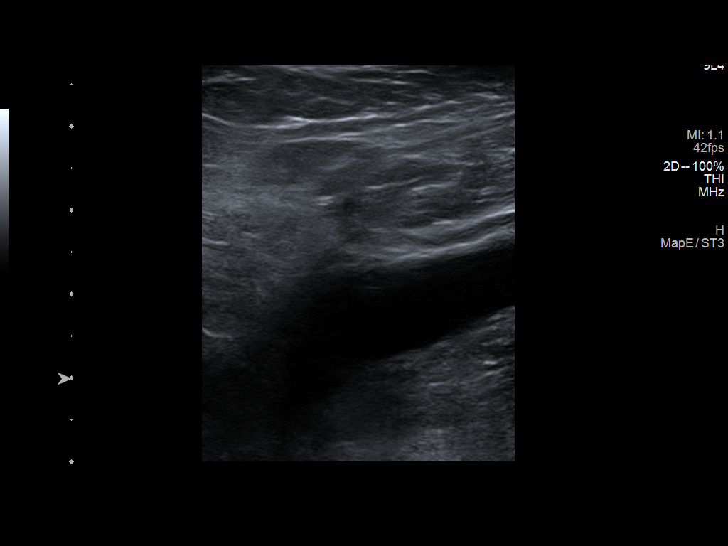
[im 9/35]
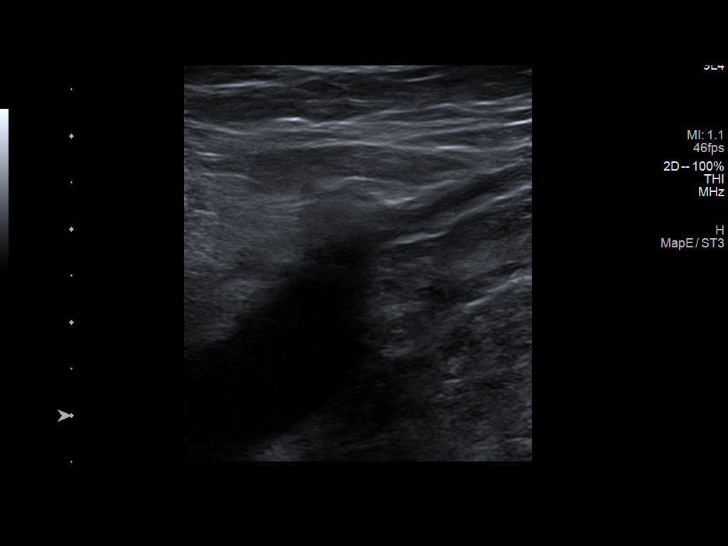
[im 12/35]
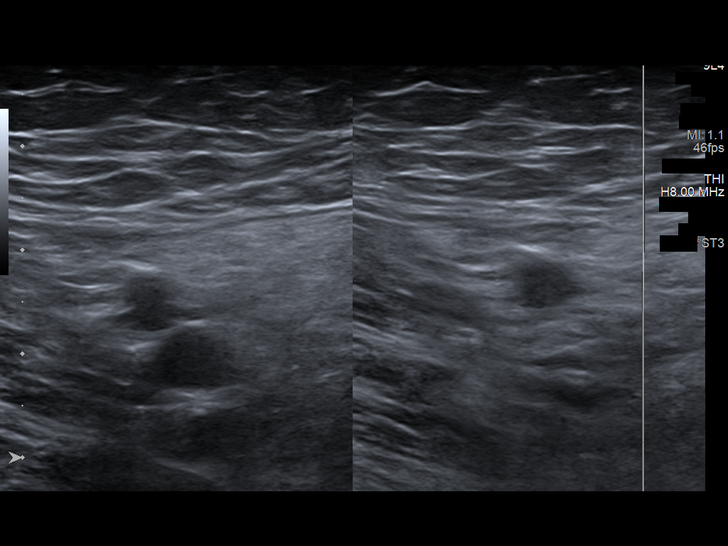
[im 15/35]
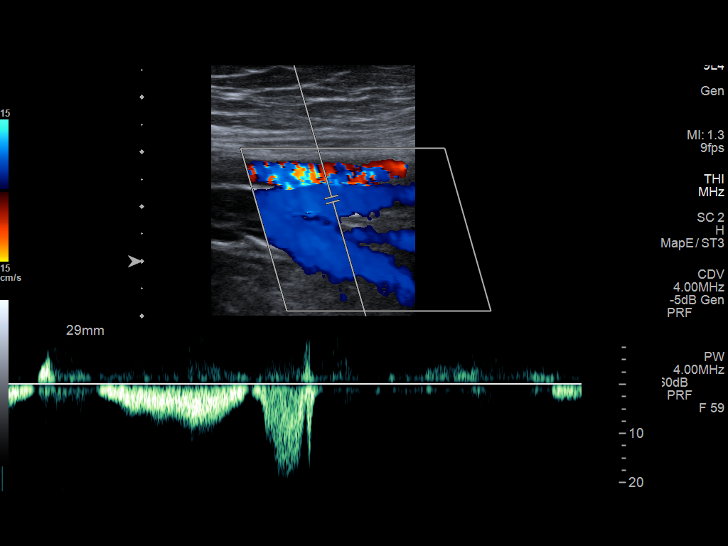
[im 18/35]
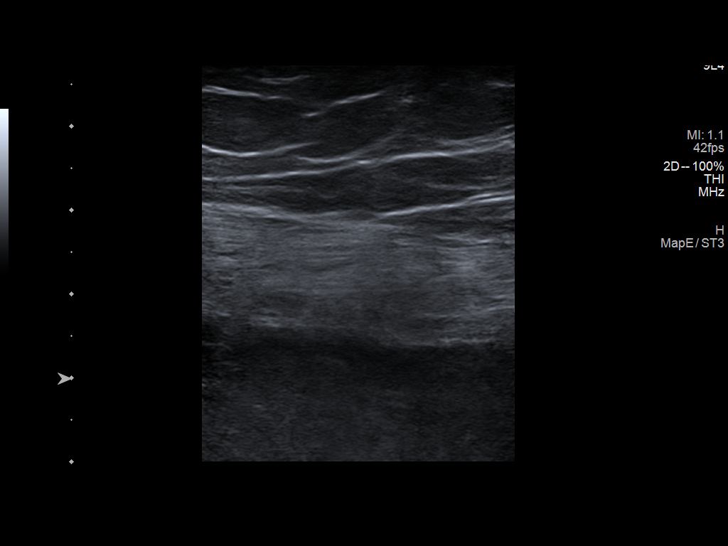
[im 20/35]
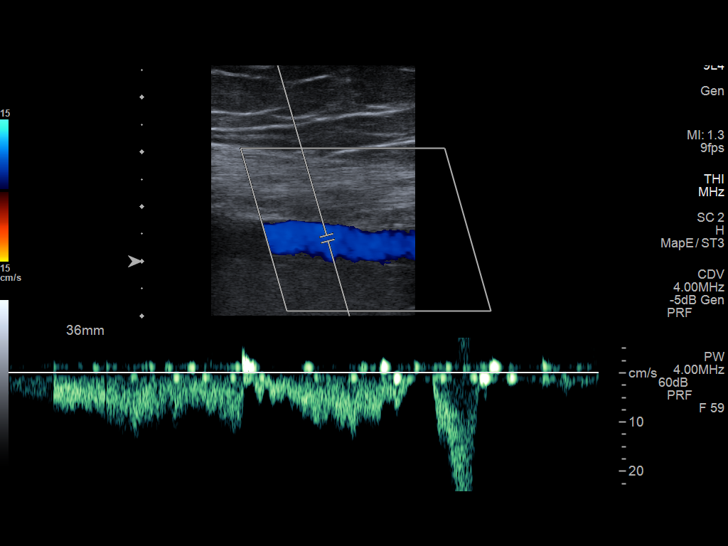
[im 23/35]
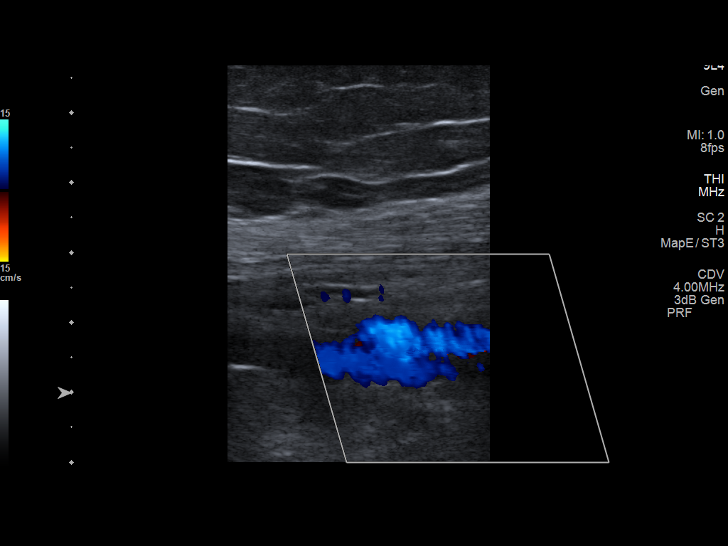
[im 26/35]
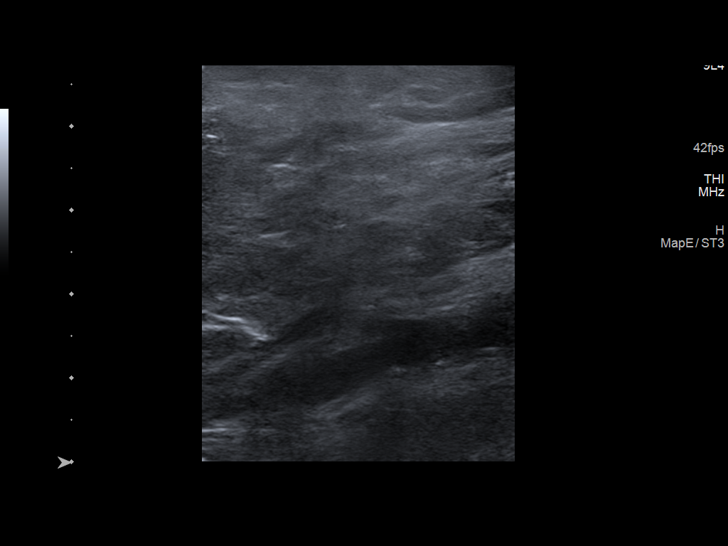
[im 29/35]
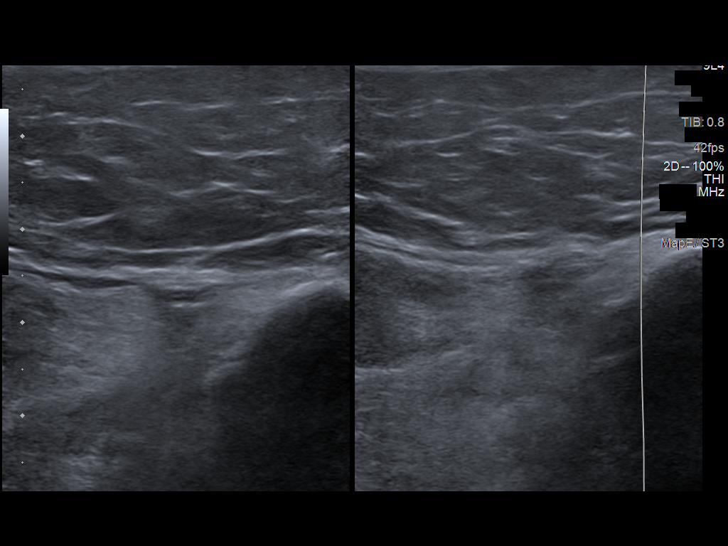
[im 32/35]
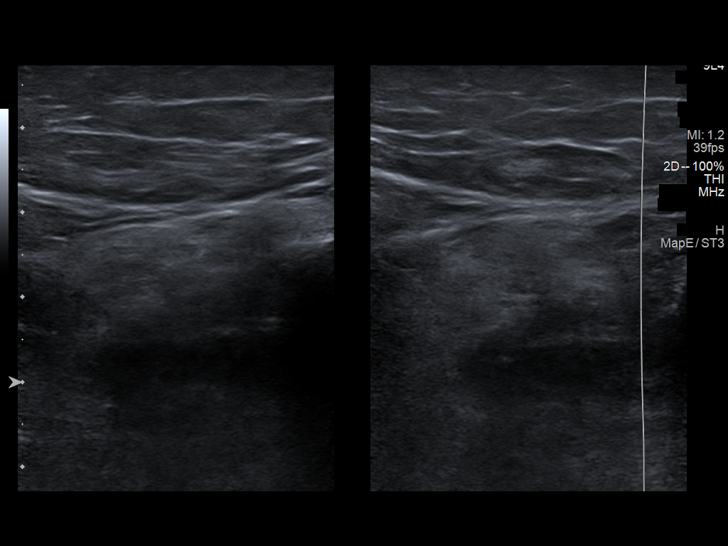
[im 35/35]
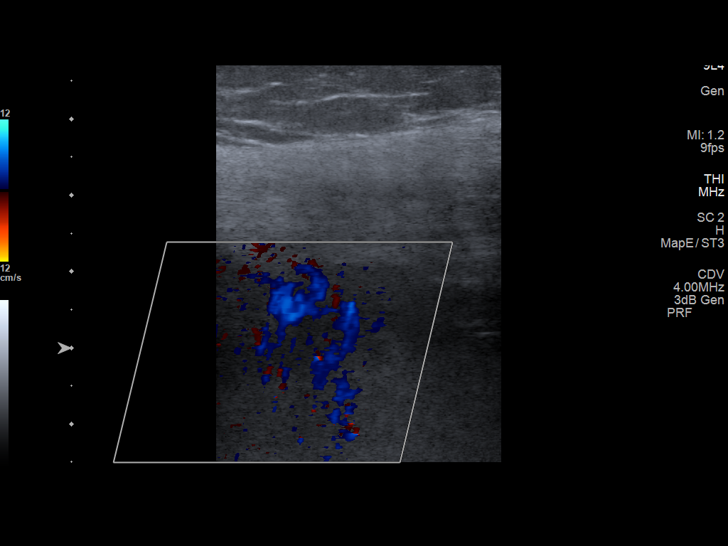

[13 of 24 positions shown; findings below may reference images not displayed]

FINDINGS: Contralateral Common Femoral Vein: Respiratory phasicity is normal
and symmetric with the symptomatic side. No evidence of thrombus.
Normal compressibility.

Common Femoral Vein: No evidence of thrombus. Normal
compressibility, respiratory phasicity and response to augmentation.

Saphenofemoral Junction: No evidence of thrombus. Normal
compressibility and flow on color Doppler imaging.

Profunda Femoral Vein: No evidence of thrombus. Normal
compressibility and flow on color Doppler imaging.

Femoral Vein: No evidence of thrombus. Normal compressibility,
respiratory phasicity and response to augmentation.

Popliteal Vein: No evidence of thrombus. Normal compressibility,
respiratory phasicity and response to augmentation.

Calf Veins: No evidence of thrombus. Normal compressibility and flow
on color Doppler imaging.

Superficial Great Saphenous Vein: No evidence of thrombus. Normal
compressibility.

Venous Reflux:  None.

Other Findings:  None.
IMPRESSION: No evidence of deep venous thrombosis in the left lower extremity.
Right common femoral vein also patent.
